# Patient Record
Sex: Female | Born: 1955 | Race: White | Hispanic: No | Marital: Single | Smoking: Former smoker
Health system: Southern US, Community
[De-identification: ages and names within clinical notes are randomized; demographics above are authoritative.]

## PROBLEM LIST (undated history)

## (undated) DIAGNOSIS — J189 Pneumonia, unspecified organism: Secondary | ICD-10-CM

## (undated) DIAGNOSIS — I10 Essential (primary) hypertension: Secondary | ICD-10-CM

## (undated) DIAGNOSIS — E785 Hyperlipidemia, unspecified: Secondary | ICD-10-CM

## (undated) DIAGNOSIS — I82409 Acute embolism and thrombosis of unspecified deep veins of unspecified lower extremity: Secondary | ICD-10-CM

## (undated) DIAGNOSIS — F32A Depression, unspecified: Secondary | ICD-10-CM

## (undated) DIAGNOSIS — H9192 Unspecified hearing loss, left ear: Secondary | ICD-10-CM

## (undated) DIAGNOSIS — G479 Sleep disorder, unspecified: Secondary | ICD-10-CM

## (undated) DIAGNOSIS — M1990 Unspecified osteoarthritis, unspecified site: Secondary | ICD-10-CM

## (undated) DIAGNOSIS — I671 Cerebral aneurysm, nonruptured: Secondary | ICD-10-CM

## (undated) DIAGNOSIS — K219 Gastro-esophageal reflux disease without esophagitis: Secondary | ICD-10-CM

## (undated) DIAGNOSIS — Z87442 Personal history of urinary calculi: Secondary | ICD-10-CM

## (undated) HISTORY — PX: TONSILLECTOMY: SUR1361

## (undated) HISTORY — PX: ABDOMINAL HYSTERECTOMY: SHX81

## (undated) HISTORY — DX: Sleep disorder, unspecified: G47.9

## (undated) HISTORY — DX: Cerebral aneurysm, nonruptured: I67.1

## (undated) HISTORY — PX: SHOULDER ARTHROSCOPY: SHX128

## (undated) HISTORY — DX: Acute embolism and thrombosis of unspecified deep veins of unspecified lower extremity: I82.409

## (undated) HISTORY — DX: Depression, unspecified: F32.A

## (undated) HISTORY — PX: CATARACT EXTRACTION: SUR2

## (undated) HISTORY — DX: Essential (primary) hypertension: I10

## (undated) HISTORY — PX: PARATHYROIDECTOMY: SHX19

## (undated) HISTORY — DX: Gastro-esophageal reflux disease without esophagitis: K21.9

## (undated) HISTORY — DX: Hyperlipidemia, unspecified: E78.5

## (undated) HISTORY — PX: OTHER SURGICAL HISTORY: SHX169

## (undated) HISTORY — DX: Unspecified hearing loss, left ear: H91.92

---

## 2016-11-19 DIAGNOSIS — G459 Transient cerebral ischemic attack, unspecified: Secondary | ICD-10-CM

## 2016-11-19 HISTORY — DX: Transient cerebral ischemic attack, unspecified: G45.9

## 2019-01-04 DIAGNOSIS — H2513 Age-related nuclear cataract, bilateral: Secondary | ICD-10-CM | POA: Diagnosis not present

## 2019-01-05 DIAGNOSIS — J209 Acute bronchitis, unspecified: Secondary | ICD-10-CM | POA: Diagnosis not present

## 2019-01-05 DIAGNOSIS — R05 Cough: Secondary | ICD-10-CM | POA: Diagnosis not present

## 2019-01-05 DIAGNOSIS — R067 Sneezing: Secondary | ICD-10-CM | POA: Diagnosis not present

## 2019-01-26 DIAGNOSIS — Z23 Encounter for immunization: Secondary | ICD-10-CM | POA: Diagnosis not present

## 2019-01-26 DIAGNOSIS — E782 Mixed hyperlipidemia: Secondary | ICD-10-CM | POA: Diagnosis not present

## 2019-01-26 DIAGNOSIS — Z9889 Other specified postprocedural states: Secondary | ICD-10-CM | POA: Diagnosis not present

## 2019-01-26 DIAGNOSIS — K219 Gastro-esophageal reflux disease without esophagitis: Secondary | ICD-10-CM | POA: Diagnosis not present

## 2019-01-26 DIAGNOSIS — I671 Cerebral aneurysm, nonruptured: Secondary | ICD-10-CM | POA: Diagnosis not present

## 2019-01-30 DIAGNOSIS — Z9889 Other specified postprocedural states: Secondary | ICD-10-CM | POA: Diagnosis not present

## 2019-02-01 ENCOUNTER — Other Ambulatory Visit: Payer: Self-pay | Admitting: Nurse Practitioner

## 2019-02-01 DIAGNOSIS — Z1231 Encounter for screening mammogram for malignant neoplasm of breast: Secondary | ICD-10-CM

## 2019-02-26 ENCOUNTER — Encounter: Payer: Self-pay | Admitting: Radiology

## 2019-02-26 ENCOUNTER — Ambulatory Visit
Admission: RE | Admit: 2019-02-26 | Discharge: 2019-02-26 | Disposition: A | Discharge status: Home / Self Care | Payer: 59 | Source: Ambulatory Visit | Attending: Nurse Practitioner | Admitting: Nurse Practitioner

## 2019-02-26 DIAGNOSIS — Z1231 Encounter for screening mammogram for malignant neoplasm of breast: Secondary | ICD-10-CM

## 2019-03-07 ENCOUNTER — Ambulatory Visit: Payer: Self-pay

## 2019-03-20 ENCOUNTER — Other Ambulatory Visit: Payer: Self-pay | Admitting: Nurse Practitioner

## 2019-03-20 DIAGNOSIS — R928 Other abnormal and inconclusive findings on diagnostic imaging of breast: Secondary | ICD-10-CM

## 2019-03-21 ENCOUNTER — Ambulatory Visit
Admission: RE | Admit: 2019-03-21 | Discharge: 2019-03-21 | Disposition: A | Discharge status: Home / Self Care | Payer: 59 | Source: Ambulatory Visit | Attending: Nurse Practitioner | Admitting: Nurse Practitioner

## 2019-03-21 ENCOUNTER — Other Ambulatory Visit: Payer: Self-pay | Admitting: Nurse Practitioner

## 2019-03-21 ENCOUNTER — Other Ambulatory Visit: Payer: Self-pay

## 2019-03-21 DIAGNOSIS — R928 Other abnormal and inconclusive findings on diagnostic imaging of breast: Secondary | ICD-10-CM

## 2019-03-21 DIAGNOSIS — N6002 Solitary cyst of left breast: Secondary | ICD-10-CM

## 2019-03-27 DIAGNOSIS — Z9889 Other specified postprocedural states: Secondary | ICD-10-CM | POA: Diagnosis not present

## 2019-03-27 DIAGNOSIS — I10 Essential (primary) hypertension: Secondary | ICD-10-CM | POA: Diagnosis not present

## 2019-03-27 DIAGNOSIS — K219 Gastro-esophageal reflux disease without esophagitis: Secondary | ICD-10-CM | POA: Diagnosis not present

## 2019-03-29 DIAGNOSIS — I10 Essential (primary) hypertension: Secondary | ICD-10-CM | POA: Diagnosis not present

## 2019-05-10 ENCOUNTER — Other Ambulatory Visit: Payer: Self-pay | Admitting: Interventional Radiology

## 2019-05-10 DIAGNOSIS — I729 Aneurysm of unspecified site: Secondary | ICD-10-CM

## 2019-05-10 DIAGNOSIS — I10 Essential (primary) hypertension: Secondary | ICD-10-CM | POA: Diagnosis not present

## 2019-05-10 DIAGNOSIS — J441 Chronic obstructive pulmonary disease with (acute) exacerbation: Secondary | ICD-10-CM | POA: Diagnosis not present

## 2019-05-11 DIAGNOSIS — R05 Cough: Secondary | ICD-10-CM | POA: Diagnosis not present

## 2019-05-15 DIAGNOSIS — R05 Cough: Secondary | ICD-10-CM | POA: Diagnosis not present

## 2019-06-06 ENCOUNTER — Other Ambulatory Visit: Payer: Self-pay

## 2019-06-06 ENCOUNTER — Ambulatory Visit
Admission: RE | Admit: 2019-06-06 | Discharge: 2019-06-06 | Disposition: A | Discharge status: Home / Self Care | Payer: 59 | Source: Ambulatory Visit | Attending: Interventional Radiology | Admitting: Interventional Radiology

## 2019-06-06 DIAGNOSIS — I729 Aneurysm of unspecified site: Secondary | ICD-10-CM

## 2019-09-25 ENCOUNTER — Other Ambulatory Visit: Payer: Self-pay | Admitting: Internal Medicine

## 2019-09-25 ENCOUNTER — Ambulatory Visit
Admission: RE | Admit: 2019-09-25 | Discharge: 2019-09-25 | Disposition: A | Discharge status: Home / Self Care | Payer: 59 | Source: Ambulatory Visit | Attending: Nurse Practitioner | Admitting: Nurse Practitioner

## 2019-09-25 ENCOUNTER — Other Ambulatory Visit: Payer: Self-pay

## 2019-09-25 DIAGNOSIS — N6002 Solitary cyst of left breast: Secondary | ICD-10-CM

## 2021-01-12 ENCOUNTER — Encounter: Payer: Self-pay | Admitting: *Deleted

## 2021-01-13 ENCOUNTER — Other Ambulatory Visit: Payer: Self-pay

## 2021-01-13 ENCOUNTER — Encounter: Payer: Self-pay | Admitting: Diagnostic Neuroimaging

## 2021-01-13 ENCOUNTER — Encounter: Payer: PRIVATE HEALTH INSURANCE | Admitting: Diagnostic Neuroimaging

## 2021-01-13 NOTE — Progress Notes (Signed)
Patient rescheduled appointment due to delay. She was made aware of delay and voiced understanding. Appointment was opened for her to be seen 01/28/21. Patient verbalized understanding, appreciation.

## 2021-01-28 ENCOUNTER — Ambulatory Visit (INDEPENDENT_AMBULATORY_CARE_PROVIDER_SITE_OTHER): Payer: PRIVATE HEALTH INSURANCE | Admitting: Diagnostic Neuroimaging

## 2021-01-28 ENCOUNTER — Encounter: Payer: Self-pay | Admitting: Diagnostic Neuroimaging

## 2021-01-28 VITALS — BP 160/100 | HR 61 | Ht 65.0 in | Wt 188.8 lb

## 2021-01-28 DIAGNOSIS — G8929 Other chronic pain: Secondary | ICD-10-CM | POA: Diagnosis not present

## 2021-01-28 DIAGNOSIS — M542 Cervicalgia: Secondary | ICD-10-CM | POA: Diagnosis not present

## 2021-01-28 DIAGNOSIS — M25512 Pain in left shoulder: Secondary | ICD-10-CM

## 2021-01-28 NOTE — Progress Notes (Signed)
[facility]  PATIENT: Teresa Lopez DOB: 1956/12/13  REFERRING CLINICIAN: Tisovec, Adelfa Koh, MD HISTORY FROM: patient REASON FOR VISIT: New consult   HISTORICAL  CHIEF COMPLAINT:  Chief Complaint  Patient presents with  . Cerebral aneurysm, unruptured    Rm7, New Pt Transfer of care    HISTORY OF PRESENT ILLNESS:   65 year old female here for evaluation of cerebral aneurysm.  2018 patient had transient aphasia event and went to the hospital for evaluation.  This was in [place].  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  These were successfully treated with pipeline embolization device procedure.  She had follow-up imaging in 2020 which was stable.  Since then patient has continued to stay stable.  No recurrent aphasia or TIA events.  No headaches.  Patient has had some left shoulder and left neck muscle spasm problems intermittently for the past year and was concerned about the symptoms.  Symptoms occur almost on a daily basis.  Symptoms last about a few seconds at a time.    REVIEW OF SYSTEMS: Full 14 system review of systems performed and negative with exception of: As per HPI.  ALLERGIES: Allergies  Allergen Reactions  . Codeine Nausea Only  . Sulfa Antibiotics     unknown  . Penicillins Rash    Unable to walk    HOME MEDICATIONS: Outpatient Medications Prior to Visit  Medication Sig Dispense Refill  . Ascorbic Acid (VITAMIN C) 1000 MG tablet Take 1,000 mg by mouth daily.    Marland Kitchen aspirin EC 81 MG tablet Take 81 mg by mouth daily. Swallow whole.    Marland Kitchen atorvastatin (LIPITOR) 20 MG tablet Take 20 mg by mouth daily.    . Cholecalciferol 1.25 MG (50000 UT) capsule Take 50,000 Units by mouth daily.    Marland Kitchen lisinopril (ZESTRIL) 10 MG tablet Take 20 mg by mouth daily.    . meloxicam (MOBIC) 15 MG tablet Take 15 mg by mouth daily.    Marland Kitchen METOPROLOL TARTRATE  PO Take 50 mg by mouth 2 (two) times daily.    Marland Kitchen omeprazole (PRILOSEC) 20 MG capsule Take 40 mg by mouth daily.    . sertraline (ZOLOFT) 100 MG tablet Take 100 mg by mouth daily.    . vitamin E 180 MG (400 UNITS) capsule Take 400 Units by mouth daily.    Marland Kitchen ipratropium (ATROVENT) 0.03 % nasal spray Place 2 sprays into both nostrils every 12 (twelve) hours.    Marland Kitchen acyclovir ointment (ZOVIRAX) 5 % Apply 1 application topically 3 (three) times daily. (Patient not taking: Reported on 01/13/2021)     No facility-administered medications prior to visit.    PAST MEDICAL HISTORY: Past Medical History:  Diagnosis Date  . Cerebral aneurysm    s/p stent x 2 12/2016  . Deaf, left   . Depression   . DVT (deep venous thrombosis) (HCC)    age 5  . GERD (gastroesophageal reflux disease)   . Hyperlipidemia   . Hypertension   . Sleep disorder    snores  . TIA (transient ischemic attack) 11/2016    PAST SURGICAL HISTORY: Past Surgical History:  Procedure Laterality Date  . ABDOMINAL HYSTERECTOMY    . kidney stones    . laser surgery     x 3- endometriosis  . PARATHYROIDECTOMY    . TONSILLECTOMY     x 2    FAMILY HISTORY: Family History  Problem Relation  Age of Onset  . Breast cancer Sister 77       second time at 81   . Cancer Mother        brain  . Thyroid disease Mother   . Heart disease Father   . Cancer Father   . Diabetes Maternal Grandmother   . Cancer Paternal Grandmother   . Hypothyroidism Sister   . Other Brother        cerebral meningitis    SOCIAL HISTORY: Social History   Socioeconomic History  . Marital status: Single    Spouse name: Not on file  . Number of children: 1  . Years of education: Not on file  . Highest education level: High school graduate  Occupational History    Comment: Education officer, environmental  Tobacco Use  . Smoking status: Former Smoker    Packs/day: 1.00    Years: 45.00    Pack years: 45.00    Quit date: 01/12/2019    Years since  quitting: 2.0  . Smokeless tobacco: Never Used  Substance and Sexual Activity  . Alcohol use: Yes    Comment: socially  . Drug use: Never  . Sexual activity: Not on file  Other Topics Concern  . Not on file  Social History Narrative   Caffeine- coffee maybe 1 c daily   Social Determinants of Health   Financial Resource Strain: Not on file  Food Insecurity: Not on file  Transportation Needs: Not on file  Physical Activity: Not on file  Stress: Not on file  Social Connections: Not on file  Intimate Partner Violence: Not on file     PHYSICAL EXAM  GENERAL EXAM/CONSTITUTIONAL: Vitals:  Vitals:   01/28/21 0919  BP: (!) 160/100  Pulse: 61  Weight: 188 lb 12.8 oz (85.6 kg)  Height: 5\' 5"  (1.651 m)     Body mass index is 31.42 kg/m. Wt Readings from Last 3 Encounters:  01/28/21 188 lb 12.8 oz (85.6 kg)  01/13/21 183 lb 12.8 oz (83.4 kg)     Patient is in no distress; well developed, nourished and groomed; neck is supple  CARDIOVASCULAR:  Examination of carotid arteries is normal; no carotid bruits  Regular rate and rhythm, no murmurs  Examination of peripheral vascular system by observation and palpation is normal  EYES:  Ophthalmoscopic exam of optic discs and posterior segments is normal; no papilledema or hemorrhages  No exam data present  MUSCULOSKELETAL:  Gait, strength, tone, movements noted in Neurologic exam below  NEUROLOGIC: MENTAL STATUS:  No flowsheet data found.  awake, alert, oriented to person, place and time  recent and remote memory intact  normal attention and concentration  language fluent, comprehension intact, naming intact  fund of knowledge appropriate  CRANIAL NERVE:   2nd - no papilledema on fundoscopic exam  2nd, 3rd, 4th, 6th - pupils equal and reactive to light, visual fields full to confrontation, extraocular muscles intact, no nystagmus  5th - facial sensation symmetric  7th - facial strength symmetric  8th  - hearing intact  9th - palate elevates symmetrically, uvula midline  11th - shoulder shrug symmetric  12th - tongue protrusion midline  MOTOR:   normal bulk and tone, full strength in the BUE, BLE  SENSORY:   normal and symmetric to light touch, temperature, vibration  COORDINATION:   finger-nose-finger, fine finger movements normal  REFLEXES:   deep tendon reflexes present and symmetric  GAIT/STATION:   narrow based gait     DIAGNOSTIC DATA (  LABS, IMAGING, TESTING) - I reviewed patient records, labs, notes, testing and imaging myself where available.  No results found for: WBC, HGB, HCT, MCV, PLT No results found for: NA, K, CL, CO2, GLUCOSE, BUN, CREATININE, CALCIUM, PROT, ALBUMIN, AST, ALT, ALKPHOS, BILITOT, GFRNONAA, GFRAA No results found for: CHOL, HDL, LDLCALC, LDLDIRECT, TRIG, CHOLHDL No results found for: HUDJ4H No results found for: VITAMINB12 No results found for: TSH   06/06/19 MRA head - No evidence of residual or recurrent aneurysmal flow.    ASSESSMENT AND PLAN  65 y.o. year old female here with:  Dx:  1. Chronic left shoulder pain   2. Neck pain on left side      PLAN:  HISTORY OF CEREBRAL ANEURYSMS (left para-ophthalmic and left PCOMM aneurysms; asymptomatic, unruptured; discovered incidentally in 2018 during TIA evaluation in [facility]; s/p pipeline embolization device treatment in 12/23/16) - continue aspirin 162mg  daily long term - check BP at home; maintain normotensive goal - continue smoking cessation - patient will follow up with Dr. about long term aneurysm monitoring schedule (every 3-5 years typically)  LEFT NECK / SHOULDER PAIN / SPASM - consider sports medicine evaluation  Orders Placed This Encounter  Procedures  . AMB referral to sports medicine   Return for pending if symptoms worsen or fail to improve, return to PCP.    Manual Meier, MD 01/28/2021, 9:50 AM Certified in Neurology,  Neurophysiology and Neuroimaging  [facility] [address] [place], [place] [id number]

## 2021-01-29 NOTE — Progress Notes (Signed)
This encounter was created in error - please disregard.

## 2021-01-30 ENCOUNTER — Other Ambulatory Visit: Payer: Self-pay

## 2021-01-30 ENCOUNTER — Encounter: Payer: Self-pay | Admitting: Family Medicine

## 2021-01-30 ENCOUNTER — Ambulatory Visit
Admission: RE | Admit: 2021-01-30 | Discharge: 2021-01-30 | Disposition: A | Discharge status: Home / Self Care | Payer: No Typology Code available for payment source | Source: Ambulatory Visit | Attending: Family Medicine | Admitting: Family Medicine

## 2021-01-30 ENCOUNTER — Ambulatory Visit (INDEPENDENT_AMBULATORY_CARE_PROVIDER_SITE_OTHER): Payer: No Typology Code available for payment source | Admitting: Family Medicine

## 2021-01-30 VITALS — BP 130/90 | Ht 65.0 in | Wt 185.0 lb

## 2021-01-30 DIAGNOSIS — M5412 Radiculopathy, cervical region: Secondary | ICD-10-CM | POA: Diagnosis not present

## 2021-01-30 NOTE — Progress Notes (Signed)
PCP: Gaspar Garbe, MD  Subjective:   HPI: Patient is a 65 y.o. female with history of cerebral aneurysms here for evaluation of chronic left shoulder pain.  She was recently seen by her neurologist, and noted to him that she was having left shoulder pain.  He did not feel this was not likely neurologic in nature or related to her aneurysms and recommended following up here for further evaluation.  Today, patient states that she has been having chronic left-sided neck pain and spasming for a long time, however the last month or 2 she is started having pain more distally anterior trapezius muscle and into her shoulder.  It stops at her shoulder and does not go down into her arm.  She states that the pain will hit her at seemingly random times, sometimes with certain motions but she is unable to reproduce these reliably.  She has some pain in her anterior shoulder but feels it is mostly over the upper shoulder/trapezius area.  She does state that it sometimes feels like a numbness and tingling but mostly as a sharp shooting pain.  She has not noticed any pain going into her arm, no weakness in her upper extremity.  No traumatic injury prior to the onset of symptoms.    Review of Systems:  Per HPI.   PMFSH, medications and smoking status reviewed.      Objective:  Physical Exam:  No flowsheet data found.   Gen: awake, alert, NAD, comfortable in exam room Pulm: breathing unlabored  Cervical spine: -Inspection: No significant abnormalities, no erythema, edema or ecchymoses. -Palpation: Tenderness palpation of the left paraspinal muscles and left trapezius with some increased muscle tension compared to contralateral side. -ROM: Full range of motion cervical spine without pain. -Strength: 5/5 strength in all planes cervical spine, some pain with resisted left lateral flexion. -Sensation: Normal sensation in the neck and bilateral upper extremities -Special tests: Negative  Spurling's  Left shoulder: -Inspection: no obvious deformity, atrophy, or asymmetry. No bruising. No swelling -Palpation: No tenderness over AC joint, + tenderness over bicipital groove -ROM: Full ROM in abduction, flexion, internal/external rotation both passively and actively.  She does have inconsistent intermittent pains in all planes.  NV intact distally Normal scapular function observed. Special Tests:  - Impingement: Neg Hawkins, Neg neers, Neg empty can sign. - Supraspinatus: Negative empty can.  5/5 strength with resisted flexion at 20 degrees - Infraspinatus/Teres Minor: 5/5 strength with ER - Subscapularis: 5/5 strength with IR, but with pain - Biceps tendon:  + Speeds  - Labrum: Negative Obriens, good stability - No painful arc and no drop arm sign  Limited US shoulder: -Biceps tendon: Well visualized within the bicipital groove and without any abnormalities.  No significant increased Doppler flow. -Subscapularis: Well visualized to insertion point on humerus.  Mild chronic degenerative changes without any acute abnormalities. Dynamic testing over the coracoid did not show signs of impingement. -AC joint: Small amount of arthritic changes without significant geyser sign or separation. -Supraspinatus: Well-visualized and without any abnormalities.  Dynamic testing did not reveal signs of impingement. -Subacromial bursa: No obvious enlargement or swelling. -Infraspinatus/teres minor: Insertion point on posterior humerus visualized and without abnormalities. -Glenohumeral joint: No significant normalities  Impression: Unremarkable examination of the left shoulder with mild chronic degenerative changes of the subscapularis.  Ultrasound performed interpreted by Al Decant, MD and Denny Levy, MD     Assessment & Plan:  1.  Left cervical radiculopathy  Differential for patient's pain  include cervical radiculopathy versus muscle strain versus rotator cuff pathology versus  biceps tendinopathy.  US examination of the shoulder including biceps tendon was fairly unremarkable making shoulder pathology less likely.  She also does have pain more in her trapezius and lateral neck raising the concern for cervical radiculopathy.  Plan: -XR cervical spine -Oral anti-inflammatories as needed -HEP with trapezius strengthening and neck stretching -Follow-up in 2 to 3 weeks to reevaluate   Guy Sandifer, MD [facility] Sports Medicine Fellow 01/30/2021 10:59 AM

## 2021-01-30 NOTE — Progress Notes (Signed)
[facility]: Attending Note: I have reviewed the chart, discussed wit the Sports Medicine Fellow. I agree with assessment and treatment plan as detailed in the Fellow's note. Left shoulder pain / left sided anterior neck pain, > 4 weeks. Right and dominant. Does some work as a Conservation officer, nature (righthand working Art gallery manager) Pain c/w chronic left trapezius pain with spasm. Deep palpation over the trap  reproduces her pain. She has some mild ttp SCM left.   I do not think this is related in any way toher known cerbral aneurysms and we discussed that with her and she was reassured. I reviewed her shoulder [place] and I do nto think this is related to any primary shoulder pathology although she has some mild AC joint DJD.    I think this is chronic strain and spasm of trapezius and SCM. Will get C spine films to rule out anything acute. HEP with emphasis on shoulder shrugs, scapular retraction, resisted neck rotation and f/u 2-3 weeks.

## 2021-02-06 ENCOUNTER — Encounter: Payer: Self-pay | Admitting: Family Medicine

## 2021-02-13 ENCOUNTER — Other Ambulatory Visit: Payer: Self-pay

## 2021-02-13 ENCOUNTER — Ambulatory Visit (INDEPENDENT_AMBULATORY_CARE_PROVIDER_SITE_OTHER): Payer: No Typology Code available for payment source | Admitting: Family Medicine

## 2021-02-13 DIAGNOSIS — M542 Cervicalgia: Secondary | ICD-10-CM | POA: Diagnosis not present

## 2021-02-13 NOTE — Progress Notes (Signed)
    SUBJECTIVE:   CHIEF COMPLAINT / HPI:   Neck pain Ms. Babich is a very pleasant 65 year old female who is coming in today for follow-up after having x-rays of her cervical spine and previously having neck pain that is radiating to the left shoulder.  Unfortunately her symptoms are still occurring although they are not worse they have not been much improved with conservative management such as heating pad, over-the-counter NSAIDs, and home exercises.  She was unaware of her x-ray results from her previous visit so we did discuss those today and talked about neck steps going forward.  Did position with her we could refer her degrees for imaging for facet joint injections to see if that offers any relief however she declines.  Overall she thinks her symptoms are somewhat manageable but would like for them to improve somewhat.  PERTINENT  PMH / PSH: Hypertension, GERD, hyperlipidemia  OBJECTIVE:   BP 118/86   Ht 5\' 5"  (1.651 m)   Wt 185 lb (83.9 kg)   BMI 30.79 kg/m   No flowsheet data found.  MSK: Inspection of the cervical spine is significant for no obvious deformity or step-offs or tenderness to palpation over the spinous processes.  She does have tenderness to palpation over the left side of the cervical paraspinal musculature.  She has good range of motion in her neck in flexion and extension, somewhat limited in rotation and side bending.  Upper extremity strength is intact rated 5/5 and gross sensation intact.  ASSESSMENT/PLAN:   Neck pain Patient is here for follow-up of neck pain and left shoulder pain.  Given that her ultrasound was normal last time and the pain is radiating from the neck to the left shoulder and her x-ray findings are consistent with cervical facet joint arthropathy I do think it is refill at this point to say the pain is coming from arthritis within the cervical spine.  She has been using conservative management with over-the-counter medications and muscle  relaxer unfortunately this is not giving her much relief.  Did offer her possibility of getting referred to [facility] imaging for an injection into the facet joints to see if that will give her some relief however she is not quite ready to do this just yet.  Given that she is having no radicular symptoms down the arm with no numbness tingling or burning unlikely that she would benefit from further imaging at this point however I did let her know that if her symptoms worsen or she develops any symptoms right radiculopathy we would need to proceed to an MRI to get more information.  She will try Voltaren gel and continue with exercises using a heating pad and over-the-counter nonsteroidal anti-inflammatories to keep control of the pain. Given that the degenerative disc disease of the cervical spine was somewhat extensive we did go ahead and refer her to a neurosurgeon to discuss what options might be available at this juncture from their point.  She does not want surgery at this time however we thought it best to go ahead and get her seen earlier rather than later.     [facility], DO PGY-4, Sports Medicine Fellow [facility]

## 2021-02-13 NOTE — Patient Instructions (Signed)
It was great to meet you today! Thank you for letting me participate in your care!  Today, we discussed your continued neck pain which is from cervical degenerative disc disease as well as arthritis in your cervical spine. Please try Voltaren gel and you can apply it up to 4 times per day on your neck. Continue using Tylenol arthritis strength, using heating pads, and doing stretching and strengthening exercises.  If the pain gets worse and it is too much to handle please let us know and we can set you up for an injection in your cervical spine that will hopefully relieve some of your symptoms.  Be well, Jules Schick, DO PGY-4, Sports Medicine Fellow [facility]

## 2021-02-14 DIAGNOSIS — M542 Cervicalgia: Secondary | ICD-10-CM | POA: Insufficient documentation

## 2021-02-14 NOTE — Assessment & Plan Note (Signed)
Patient is here for follow-up of neck pain and left shoulder pain.  Given that her ultrasound was normal last time and the pain is radiating from the neck to the left shoulder and her x-ray findings are consistent with cervical facet joint arthropathy I do think it is refill at this point to say the pain is coming from arthritis within the cervical spine.  She has been using conservative management with over-the-counter medications and muscle relaxer unfortunately this is not giving her much relief.  Did offer her possibility of getting referred to [facility] imaging for an injection into the facet joints to see if that will give her some relief however she is not quite ready to do this just yet.  Given that she is having no radicular symptoms down the arm with no numbness tingling or burning unlikely that she would benefit from further imaging at this point however I did let her know that if her symptoms worsen or she develops any symptoms right radiculopathy we would need to proceed to an MRI to get more information.  She will try Voltaren gel and continue with exercises using a heating pad and over-the-counter nonsteroidal anti-inflammatories to keep control of the pain. Given that the degenerative disc disease of the cervical spine was somewhat extensive we did go ahead and refer her to a neurosurgeon to discuss what options might be available at this juncture from their point.  She does not want surgery at this time however we thought it best to go ahead and get her seen earlier rather than later.

## 2021-02-15 NOTE — Progress Notes (Signed)
[facility]: Attending Note: I have reviewed the chart, discussed wit the Sports Medicine Fellow. I agree with assessment and treatment plan as detailed in the Fellow's note. Given the severity of her neck DJD as seen on plain film, I would like NSU to evaluate as I suspect she may need consideration of stabilization, if not now then at some future date. She is amenable to NSU evaluation. She has hx of parathyroid or thryoid surgery and those surgical clips are also seen on x rays but do not seem to be of consequence.

## 2021-05-27 ENCOUNTER — Other Ambulatory Visit: Payer: Self-pay | Admitting: Internal Medicine

## 2021-05-27 DIAGNOSIS — Z1231 Encounter for screening mammogram for malignant neoplasm of breast: Secondary | ICD-10-CM

## 2021-06-08 ENCOUNTER — Encounter (INDEPENDENT_AMBULATORY_CARE_PROVIDER_SITE_OTHER): Payer: Managed Care, Other (non HMO) | Admitting: Ophthalmology

## 2021-06-08 ENCOUNTER — Other Ambulatory Visit: Payer: Self-pay

## 2021-06-08 DIAGNOSIS — I10 Essential (primary) hypertension: Secondary | ICD-10-CM | POA: Diagnosis not present

## 2021-06-08 DIAGNOSIS — H43813 Vitreous degeneration, bilateral: Secondary | ICD-10-CM | POA: Diagnosis not present

## 2021-06-08 DIAGNOSIS — H353132 Nonexudative age-related macular degeneration, bilateral, intermediate dry stage: Secondary | ICD-10-CM | POA: Diagnosis not present

## 2021-06-08 DIAGNOSIS — H2513 Age-related nuclear cataract, bilateral: Secondary | ICD-10-CM

## 2021-06-08 DIAGNOSIS — H35033 Hypertensive retinopathy, bilateral: Secondary | ICD-10-CM | POA: Diagnosis not present

## 2021-06-17 ENCOUNTER — Other Ambulatory Visit: Payer: Self-pay

## 2021-06-17 ENCOUNTER — Ambulatory Visit
Admission: RE | Admit: 2021-06-17 | Discharge: 2021-06-17 | Disposition: A | Discharge status: Home / Self Care | Payer: No Typology Code available for payment source | Source: Ambulatory Visit | Attending: Internal Medicine | Admitting: Internal Medicine

## 2021-06-17 DIAGNOSIS — Z1231 Encounter for screening mammogram for malignant neoplasm of breast: Secondary | ICD-10-CM

## 2021-09-05 IMAGING — MG MM DIGITAL SCREENING BILAT W/ TOMO AND CAD
8 series · 8 of 24 positions shown · non-contrast
Comparison: Previous exam(s).

CLINICAL DATA: Screening.

EXAM:
DIGITAL SCREENING BILATERAL MAMMOGRAM WITH TOMOSYNTHESIS AND CAD
TECHNIQUE: Bilateral screening digital craniocaudal and mediolateral oblique
mammograms were obtained. Bilateral screening digital breast
tomosynthesis was performed. The images were evaluated with
computer-aided detection.

[R MLO synth-2D]
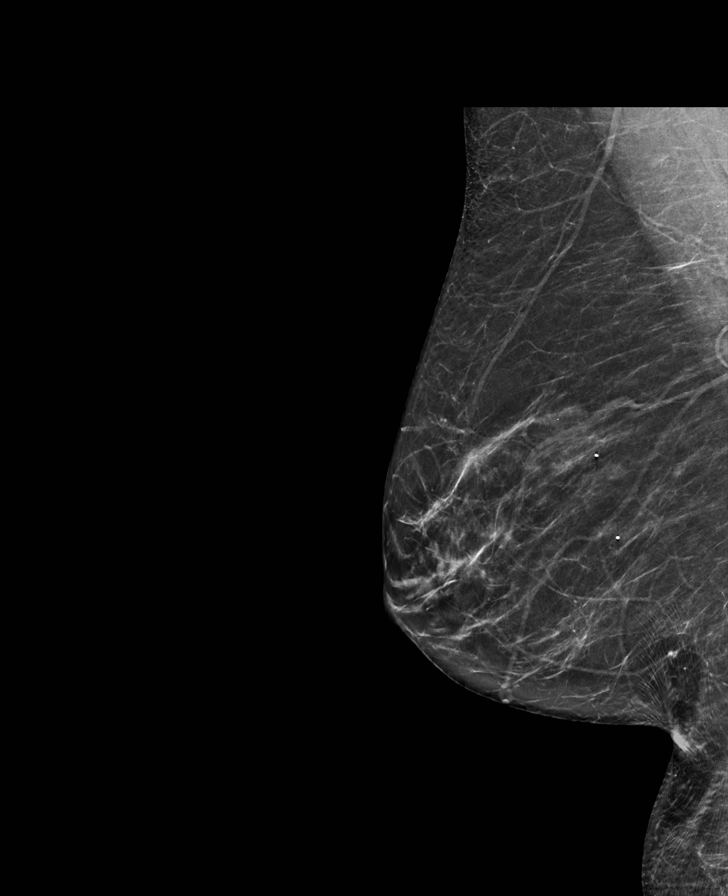

[L MLO synth-2D]
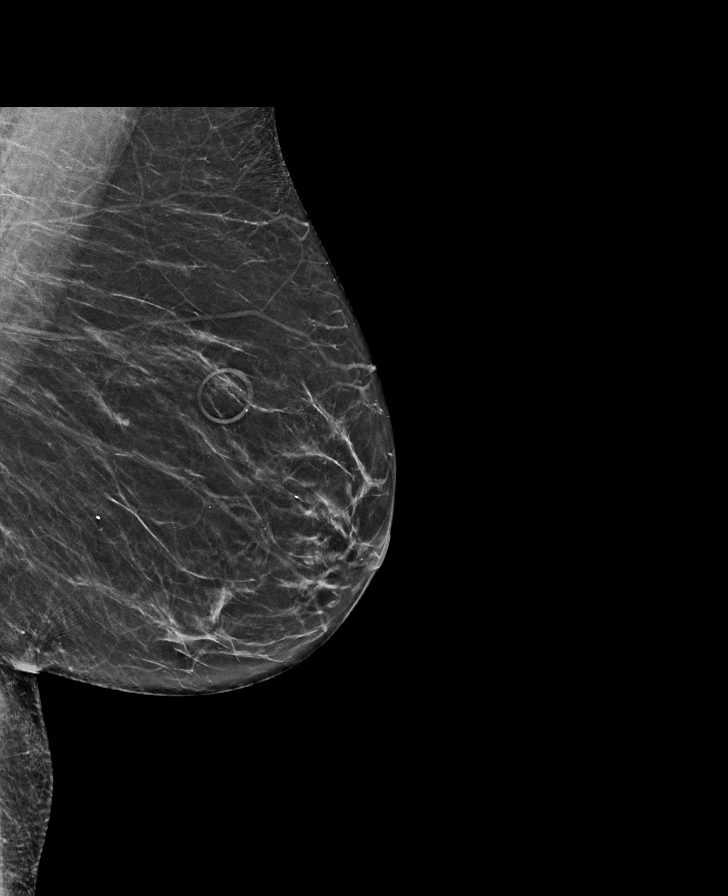

[L CC synth-2D]
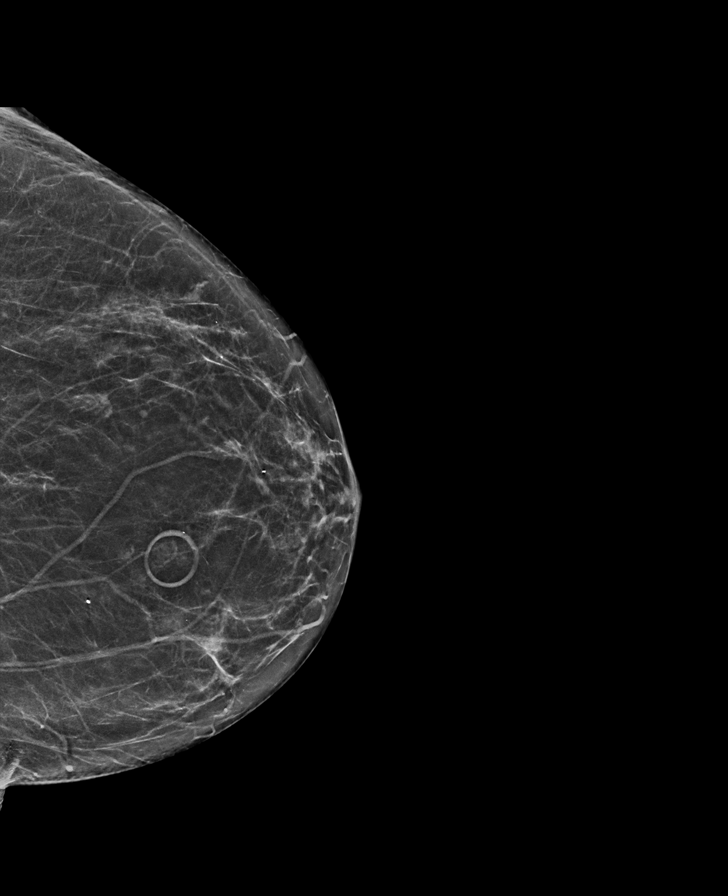

[R CC synth-2D]
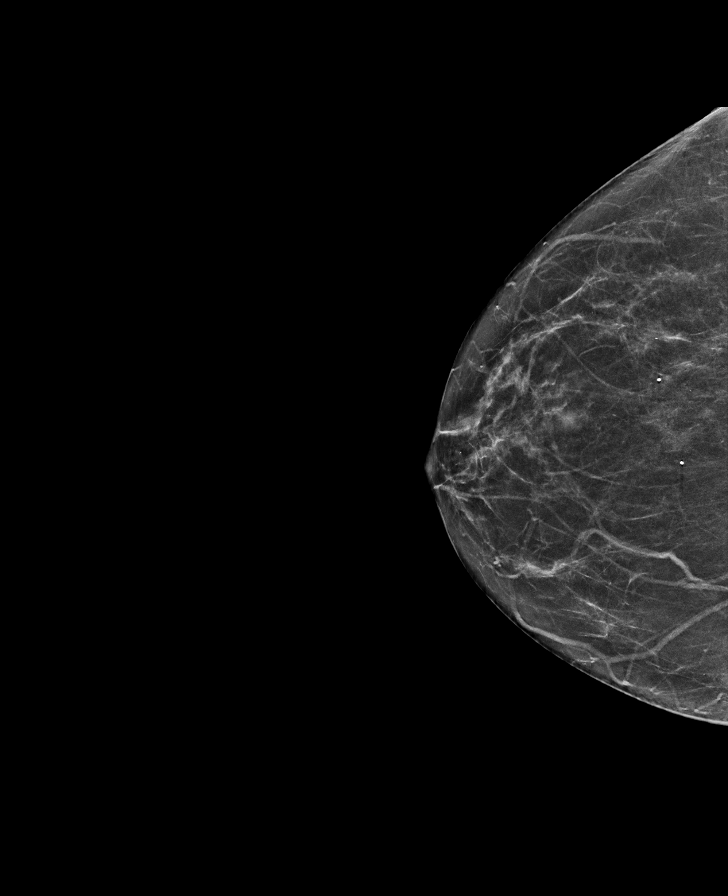

[R CC tomo · tomo slice 29/56.0]
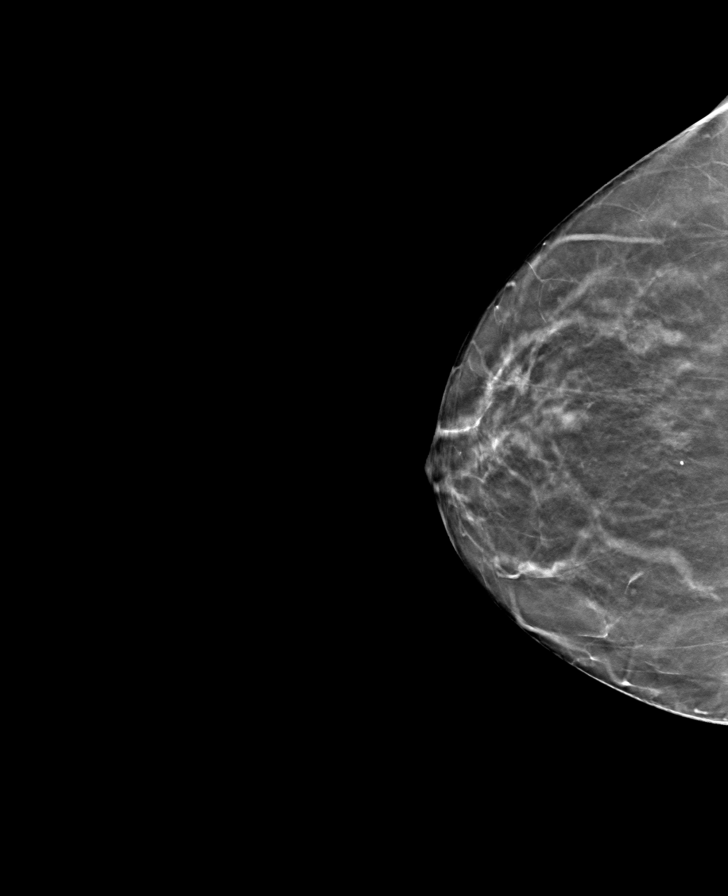

[L MLO tomo · tomo slice 31/61.0]
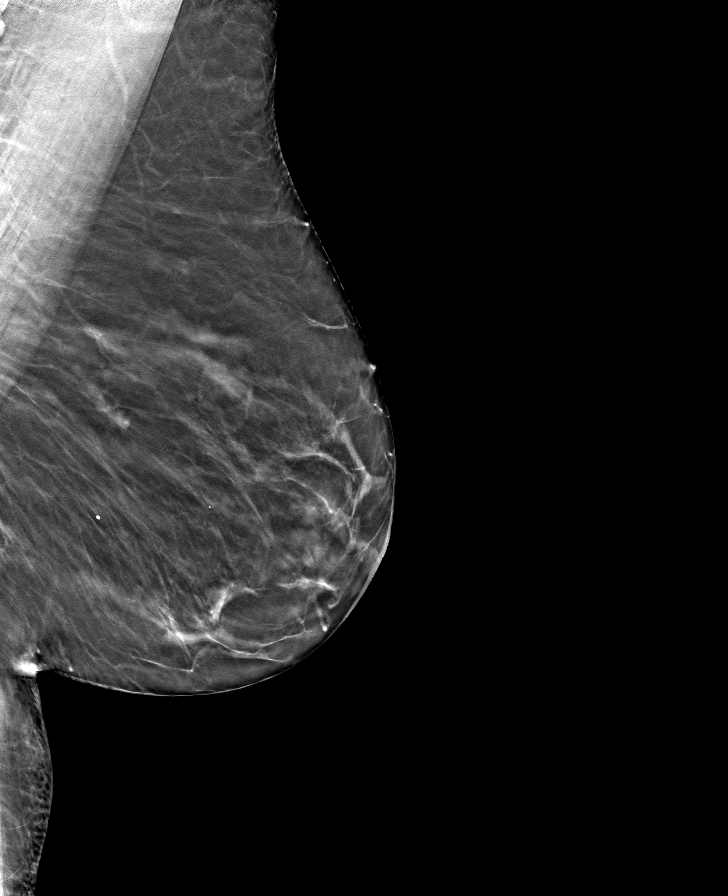

[R MLO tomo · tomo slice 33/65.0]
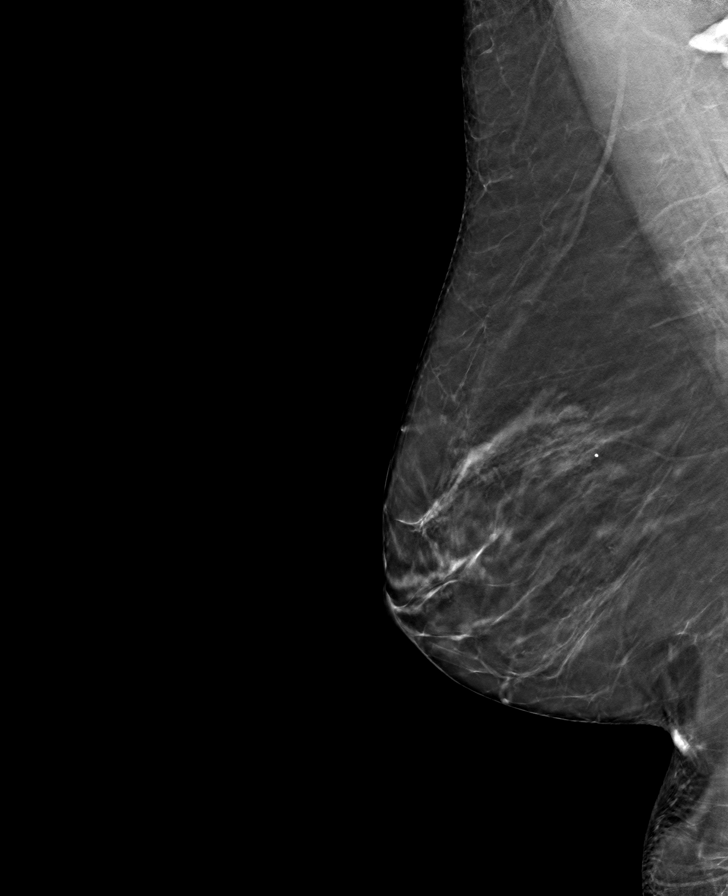

[L CC tomo · tomo slice 30/59.0]
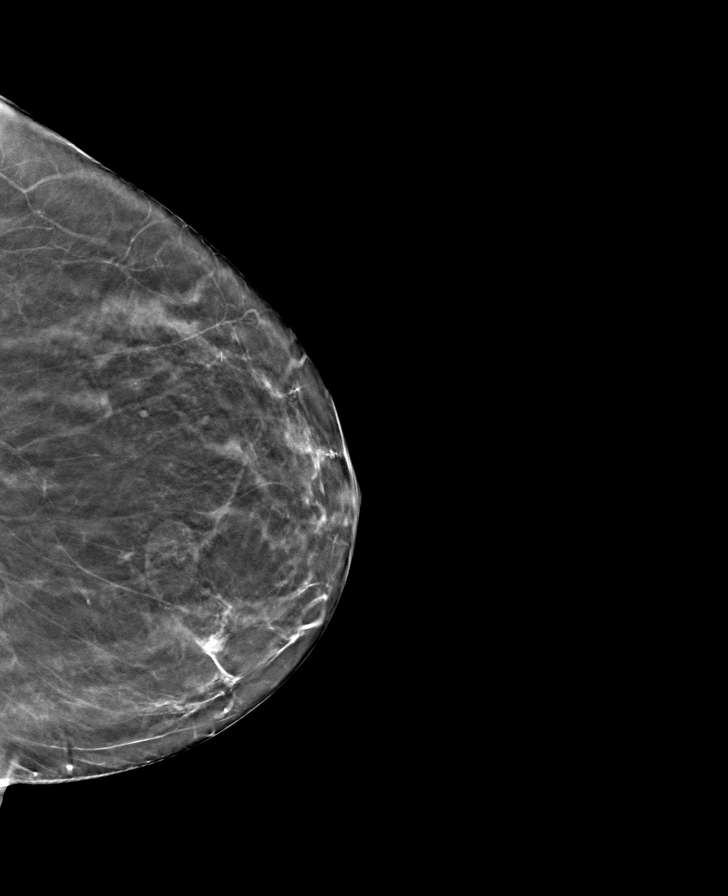

[8 of 24 positions shown; findings below may reference images not displayed]

ACR Breast Density Category b: There are scattered areas of
fibroglandular density.
FINDINGS: There are no findings suspicious for malignancy.
IMPRESSION: No mammographic evidence of malignancy. A result letter of this
screening mammogram will be mailed directly to the patient.

RECOMMENDATION:
Screening mammogram in one year. (Code:51-O-LD2)

BI-RADS CATEGORY  1: Negative.

## 2021-10-19 ENCOUNTER — Encounter (INDEPENDENT_AMBULATORY_CARE_PROVIDER_SITE_OTHER): Payer: Managed Care, Other (non HMO) | Admitting: Ophthalmology

## 2022-05-14 ENCOUNTER — Other Ambulatory Visit: Payer: Self-pay | Admitting: Internal Medicine

## 2022-05-14 DIAGNOSIS — Z1231 Encounter for screening mammogram for malignant neoplasm of breast: Secondary | ICD-10-CM

## 2022-06-18 ENCOUNTER — Ambulatory Visit
Admission: RE | Admit: 2022-06-18 | Discharge: 2022-06-18 | Disposition: A | Discharge status: Home / Self Care | Payer: Managed Care, Other (non HMO) | Source: Ambulatory Visit | Attending: Internal Medicine | Admitting: Internal Medicine

## 2022-06-18 DIAGNOSIS — Z1231 Encounter for screening mammogram for malignant neoplasm of breast: Secondary | ICD-10-CM

## 2022-06-26 DIAGNOSIS — Z743 Need for continuous supervision: Secondary | ICD-10-CM | POA: Diagnosis not present

## 2022-06-26 DIAGNOSIS — F10929 Alcohol use, unspecified with intoxication, unspecified: Secondary | ICD-10-CM | POA: Diagnosis not present

## 2022-11-21 DIAGNOSIS — J069 Acute upper respiratory infection, unspecified: Secondary | ICD-10-CM | POA: Diagnosis not present

## 2022-11-21 DIAGNOSIS — Z20822 Contact with and (suspected) exposure to covid-19: Secondary | ICD-10-CM | POA: Diagnosis not present

## 2022-11-21 DIAGNOSIS — J019 Acute sinusitis, unspecified: Secondary | ICD-10-CM | POA: Diagnosis not present

## 2022-11-21 DIAGNOSIS — I10 Essential (primary) hypertension: Secondary | ICD-10-CM | POA: Diagnosis not present

## 2022-11-21 DIAGNOSIS — R059 Cough, unspecified: Secondary | ICD-10-CM | POA: Diagnosis not present

## 2022-11-26 DIAGNOSIS — F17201 Nicotine dependence, unspecified, in remission: Secondary | ICD-10-CM | POA: Diagnosis not present

## 2022-11-26 DIAGNOSIS — G479 Sleep disorder, unspecified: Secondary | ICD-10-CM | POA: Diagnosis not present

## 2022-11-26 DIAGNOSIS — D649 Anemia, unspecified: Secondary | ICD-10-CM | POA: Diagnosis not present

## 2022-11-26 DIAGNOSIS — K219 Gastro-esophageal reflux disease without esophagitis: Secondary | ICD-10-CM | POA: Diagnosis not present

## 2022-11-26 DIAGNOSIS — F321 Major depressive disorder, single episode, moderate: Secondary | ICD-10-CM | POA: Diagnosis not present

## 2022-11-26 DIAGNOSIS — E7800 Pure hypercholesterolemia, unspecified: Secondary | ICD-10-CM | POA: Diagnosis not present

## 2022-11-26 DIAGNOSIS — I10 Essential (primary) hypertension: Secondary | ICD-10-CM | POA: Diagnosis not present

## 2022-11-26 DIAGNOSIS — E669 Obesity, unspecified: Secondary | ICD-10-CM | POA: Diagnosis not present

## 2022-11-26 DIAGNOSIS — Z1331 Encounter for screening for depression: Secondary | ICD-10-CM | POA: Diagnosis not present

## 2022-12-03 DIAGNOSIS — M25512 Pain in left shoulder: Secondary | ICD-10-CM | POA: Diagnosis not present

## 2023-01-10 DIAGNOSIS — H16101 Unspecified superficial keratitis, right eye: Secondary | ICD-10-CM | POA: Diagnosis not present

## 2023-01-14 DIAGNOSIS — M25512 Pain in left shoulder: Secondary | ICD-10-CM | POA: Diagnosis not present

## 2023-01-31 DIAGNOSIS — M25512 Pain in left shoulder: Secondary | ICD-10-CM | POA: Diagnosis not present

## 2023-02-04 DIAGNOSIS — M75122 Complete rotator cuff tear or rupture of left shoulder, not specified as traumatic: Secondary | ICD-10-CM | POA: Diagnosis not present

## 2023-02-21 DIAGNOSIS — H04123 Dry eye syndrome of bilateral lacrimal glands: Secondary | ICD-10-CM | POA: Diagnosis not present

## 2023-02-21 DIAGNOSIS — H26493 Other secondary cataract, bilateral: Secondary | ICD-10-CM | POA: Diagnosis not present

## 2023-02-21 DIAGNOSIS — H35033 Hypertensive retinopathy, bilateral: Secondary | ICD-10-CM | POA: Diagnosis not present

## 2023-02-21 DIAGNOSIS — H353132 Nonexudative age-related macular degeneration, bilateral, intermediate dry stage: Secondary | ICD-10-CM | POA: Diagnosis not present

## 2023-02-22 DIAGNOSIS — M7522 Bicipital tendinitis, left shoulder: Secondary | ICD-10-CM | POA: Diagnosis not present

## 2023-02-22 DIAGNOSIS — M75122 Complete rotator cuff tear or rupture of left shoulder, not specified as traumatic: Secondary | ICD-10-CM | POA: Diagnosis not present

## 2023-02-22 DIAGNOSIS — M7542 Impingement syndrome of left shoulder: Secondary | ICD-10-CM | POA: Diagnosis not present

## 2023-03-14 DIAGNOSIS — M7552 Bursitis of left shoulder: Secondary | ICD-10-CM | POA: Diagnosis not present

## 2023-03-14 DIAGNOSIS — M24112 Other articular cartilage disorders, left shoulder: Secondary | ICD-10-CM | POA: Diagnosis not present

## 2023-03-14 DIAGNOSIS — M7522 Bicipital tendinitis, left shoulder: Secondary | ICD-10-CM | POA: Diagnosis not present

## 2023-03-14 DIAGNOSIS — M7542 Impingement syndrome of left shoulder: Secondary | ICD-10-CM | POA: Diagnosis not present

## 2023-03-14 DIAGNOSIS — G8918 Other acute postprocedural pain: Secondary | ICD-10-CM | POA: Diagnosis not present

## 2023-03-21 DIAGNOSIS — M25512 Pain in left shoulder: Secondary | ICD-10-CM | POA: Diagnosis not present

## 2023-03-24 DIAGNOSIS — M25512 Pain in left shoulder: Secondary | ICD-10-CM | POA: Diagnosis not present

## 2023-04-01 DIAGNOSIS — M25512 Pain in left shoulder: Secondary | ICD-10-CM | POA: Diagnosis not present

## 2023-04-04 DIAGNOSIS — H26491 Other secondary cataract, right eye: Secondary | ICD-10-CM | POA: Diagnosis not present

## 2023-04-04 DIAGNOSIS — M25512 Pain in left shoulder: Secondary | ICD-10-CM | POA: Diagnosis not present

## 2023-04-06 DIAGNOSIS — M25512 Pain in left shoulder: Secondary | ICD-10-CM | POA: Diagnosis not present

## 2023-04-12 DIAGNOSIS — M25512 Pain in left shoulder: Secondary | ICD-10-CM | POA: Diagnosis not present

## 2023-04-14 DIAGNOSIS — M25512 Pain in left shoulder: Secondary | ICD-10-CM | POA: Diagnosis not present

## 2023-04-18 DIAGNOSIS — M25512 Pain in left shoulder: Secondary | ICD-10-CM | POA: Diagnosis not present

## 2023-04-27 DIAGNOSIS — H26492 Other secondary cataract, left eye: Secondary | ICD-10-CM | POA: Diagnosis not present

## 2023-05-03 DIAGNOSIS — R051 Acute cough: Secondary | ICD-10-CM | POA: Diagnosis not present

## 2023-05-03 DIAGNOSIS — J069 Acute upper respiratory infection, unspecified: Secondary | ICD-10-CM | POA: Diagnosis not present

## 2023-05-03 DIAGNOSIS — I10 Essential (primary) hypertension: Secondary | ICD-10-CM | POA: Diagnosis not present

## 2023-05-11 ENCOUNTER — Other Ambulatory Visit: Payer: Self-pay | Admitting: Internal Medicine

## 2023-05-11 DIAGNOSIS — Z0000 Encounter for general adult medical examination without abnormal findings: Secondary | ICD-10-CM

## 2023-05-23 DIAGNOSIS — E7800 Pure hypercholesterolemia, unspecified: Secondary | ICD-10-CM | POA: Diagnosis not present

## 2023-05-23 DIAGNOSIS — D649 Anemia, unspecified: Secondary | ICD-10-CM | POA: Diagnosis not present

## 2023-05-23 DIAGNOSIS — Z0000 Encounter for general adult medical examination without abnormal findings: Secondary | ICD-10-CM | POA: Diagnosis not present

## 2023-05-23 DIAGNOSIS — R7989 Other specified abnormal findings of blood chemistry: Secondary | ICD-10-CM | POA: Diagnosis not present

## 2023-05-23 DIAGNOSIS — I10 Essential (primary) hypertension: Secondary | ICD-10-CM | POA: Diagnosis not present

## 2023-05-23 DIAGNOSIS — K219 Gastro-esophageal reflux disease without esophagitis: Secondary | ICD-10-CM | POA: Diagnosis not present

## 2023-05-30 DIAGNOSIS — Z0000 Encounter for general adult medical examination without abnormal findings: Secondary | ICD-10-CM | POA: Diagnosis not present

## 2023-05-30 DIAGNOSIS — L03039 Cellulitis of unspecified toe: Secondary | ICD-10-CM | POA: Diagnosis not present

## 2023-05-30 DIAGNOSIS — I10 Essential (primary) hypertension: Secondary | ICD-10-CM | POA: Diagnosis not present

## 2023-05-30 DIAGNOSIS — R053 Chronic cough: Secondary | ICD-10-CM | POA: Diagnosis not present

## 2023-05-30 DIAGNOSIS — E669 Obesity, unspecified: Secondary | ICD-10-CM | POA: Diagnosis not present

## 2023-05-30 DIAGNOSIS — F321 Major depressive disorder, single episode, moderate: Secondary | ICD-10-CM | POA: Diagnosis not present

## 2023-05-30 DIAGNOSIS — E7800 Pure hypercholesterolemia, unspecified: Secondary | ICD-10-CM | POA: Diagnosis not present

## 2023-05-30 DIAGNOSIS — R82998 Other abnormal findings in urine: Secondary | ICD-10-CM | POA: Diagnosis not present

## 2023-05-30 DIAGNOSIS — F17201 Nicotine dependence, unspecified, in remission: Secondary | ICD-10-CM | POA: Diagnosis not present

## 2023-06-20 ENCOUNTER — Ambulatory Visit: Payer: Managed Care, Other (non HMO)

## 2023-06-22 ENCOUNTER — Ambulatory Visit: Payer: Managed Care, Other (non HMO)

## 2023-06-24 ENCOUNTER — Ambulatory Visit
Admission: RE | Admit: 2023-06-24 | Discharge: 2023-06-24 | Disposition: A | Discharge status: Home / Self Care | Payer: Medicare HMO | Source: Ambulatory Visit | Attending: Internal Medicine | Admitting: Internal Medicine

## 2023-06-24 DIAGNOSIS — Z0000 Encounter for general adult medical examination without abnormal findings: Secondary | ICD-10-CM

## 2023-06-24 DIAGNOSIS — Z1231 Encounter for screening mammogram for malignant neoplasm of breast: Secondary | ICD-10-CM | POA: Diagnosis not present

## 2023-07-06 DIAGNOSIS — S76112A Strain of left quadriceps muscle, fascia and tendon, initial encounter: Secondary | ICD-10-CM | POA: Diagnosis not present

## 2023-07-06 DIAGNOSIS — M25562 Pain in left knee: Secondary | ICD-10-CM | POA: Diagnosis not present

## 2023-07-28 DIAGNOSIS — M545 Low back pain, unspecified: Secondary | ICD-10-CM | POA: Diagnosis not present

## 2023-08-03 ENCOUNTER — Other Ambulatory Visit: Payer: Self-pay | Admitting: Registered Nurse

## 2023-08-03 ENCOUNTER — Encounter: Payer: Self-pay | Admitting: Registered Nurse

## 2023-08-03 ENCOUNTER — Ambulatory Visit
Admission: RE | Admit: 2023-08-03 | Discharge: 2023-08-03 | Disposition: A | Discharge status: Home / Self Care | Payer: Medicare HMO | Source: Ambulatory Visit | Attending: Registered Nurse | Admitting: Registered Nurse

## 2023-08-03 DIAGNOSIS — E279 Disorder of adrenal gland, unspecified: Secondary | ICD-10-CM | POA: Diagnosis not present

## 2023-08-03 DIAGNOSIS — R3129 Other microscopic hematuria: Secondary | ICD-10-CM | POA: Diagnosis not present

## 2023-08-03 DIAGNOSIS — I700 Atherosclerosis of aorta: Secondary | ICD-10-CM | POA: Diagnosis not present

## 2023-08-03 DIAGNOSIS — M5451 Vertebrogenic low back pain: Secondary | ICD-10-CM | POA: Diagnosis not present

## 2023-08-03 DIAGNOSIS — N200 Calculus of kidney: Secondary | ICD-10-CM | POA: Diagnosis not present

## 2023-08-03 DIAGNOSIS — R109 Unspecified abdominal pain: Secondary | ICD-10-CM | POA: Diagnosis not present

## 2023-08-03 DIAGNOSIS — Z87442 Personal history of urinary calculi: Secondary | ICD-10-CM

## 2023-08-05 DIAGNOSIS — N200 Calculus of kidney: Secondary | ICD-10-CM | POA: Diagnosis not present

## 2023-08-05 DIAGNOSIS — D4412 Neoplasm of uncertain behavior of left adrenal gland: Secondary | ICD-10-CM | POA: Diagnosis not present

## 2023-08-08 ENCOUNTER — Other Ambulatory Visit: Payer: Self-pay | Admitting: Urology

## 2023-08-09 ENCOUNTER — Other Ambulatory Visit (HOSPITAL_COMMUNITY): Payer: Self-pay | Admitting: Urology

## 2023-08-09 DIAGNOSIS — N200 Calculus of kidney: Secondary | ICD-10-CM

## 2023-08-18 DIAGNOSIS — N200 Calculus of kidney: Secondary | ICD-10-CM | POA: Diagnosis not present

## 2023-08-23 NOTE — Progress Notes (Addendum)
COVID Vaccine Completed: yes  Date of COVID positive in last 90 days: no  PCP - Guerry Bruin, MD Cardiologist - n/a  Chest x-ray - yes last winter EKG - 08/24/23 Epic/chart Stress Test - 2010 per pt in [place], was ok per pt ECHO - n/a Cardiac Cath - n/a Pacemaker/ICD device last checked: n/a Spinal Cord Stimulator: n/a  Bowel Prep - no  Sleep Study - n/a CPAP -   Fasting Blood Sugar - n/a Checks Blood Sugar _____ times a day  Last dose of GLP1 agonist-  N/A GLP1 instructions:  N/A   Last dose of SGLT-2 inhibitors-  N/A SGLT-2 instructions: N/A   Blood Thinner Instructions:  Time Aspirin Instructions: ASA 81, hold 5 days Last Dose:  Activity level: Can go up a flight of stairs and perform activities of daily living without stopping and without symptoms of chest pain or shortness of breath. Difficulty with stairs due to knees  Anesthesia review: TIA, DVT, HTN,   Patient denies shortness of breath, fever, cough and chest pain at PAT appointment  Patient verbalized understanding of instructions that were given to them at the PAT appointment. Patient was also instructed that they will need to review over the PAT instructions again at home before surgery.

## 2023-08-23 NOTE — Patient Instructions (Addendum)
SURGICAL WAITING ROOM VISITATION  Patients having surgery or a procedure may have no more than 2 support people in the waiting area - these visitors may rotate.    Children under the age of 24 must have an adult with them who is not the patient.  Due to an increase in RSV and influenza rates and associated hospitalizations, children ages 19 and under may not visit patients in [facility] hospitals.  If the patient needs to stay at the hospital during part of their recovery, the visitor guidelines for inpatient rooms apply. Pre-op nurse will coordinate an appropriate time for 1 support person to accompany patient in pre-op.  This support person may not rotate.    Please refer to the [facility] website for the visitor guidelines for Inpatients (after your surgery is over and you are in a regular room).    Your procedure is scheduled on: 09/06/23   Report to [facility] Main Entrance    Report to admitting at 6:45 AM   Call this number if you have problems the morning of surgery [phone]   Do not eat food or drink liquids :After Midnight.          If you have questions, please contact your surgeon's office.   FOLLOW BOWEL PREP AND ANY ADDITIONAL PRE OP INSTRUCTIONS YOU RECEIVED FROM YOUR SURGEON'S OFFICE!!!     Oral Hygiene is also important to reduce your risk of infection.                                    Remember - BRUSH YOUR TEETH THE MORNING OF SURGERY WITH YOUR REGULAR TOOTHPASTE  DENTURES WILL BE REMOVED PRIOR TO SURGERY PLEASE DO NOT APPLY "Poly grip" OR ADHESIVES!!!   Stop all vitamins and herbal supplements 7 days before surgery.   Take these medicines the morning of surgery with A SIP OF WATER: Atorvastatin, Metoprolol, Omeprazole, Sertraline                               You may not have any metal on your body including hair pins, jewelry, and body piercing             Do not wear make-up, lotions, powders, perfumes, or deodorant  Do not wear nail  polish including gel and S&S, artificial/acrylic nails, or any other type of covering on natural nails including finger and toenails. If you have artificial nails, gel coating, etc. that needs to be removed by a nail salon please have this removed prior to surgery or surgery may need to be canceled/ delayed if the surgeon/ anesthesia feels like they are unable to be safely monitored.   Do not shave  48 hours prior to surgery.    Do not bring valuables to the hospital. [facility] IS NOT             RESPONSIBLE   FOR VALUABLES.   Contacts, glasses, dentures or bridgework may not be worn into surgery.   Bring small overnight bag day of surgery.   DO NOT BRING YOUR HOME MEDICATIONS TO THE HOSPITAL. PHARMACY WILL DISPENSE MEDICATIONS LISTED ON YOUR MEDICATION LIST TO YOU DURING YOUR ADMISSION IN THE HOSPITAL!   Special Instructions: Bring a copy of your healthcare power of attorney and living will documents the day of surgery if you haven't scanned them before.  Please read over the following fact sheets you were given: IF YOU HAVE QUESTIONS ABOUT YOUR PRE-OP INSTRUCTIONS PLEASE CALL [phone] [place]ras    If you received a COVID test during your pre-op visit  it is requested that you wear a mask when out in public, stay away from anyone that may not be feeling well and notify your surgeon if you develop symptoms. If you test positive for Covid or have been in contact with anyone that has tested positive in the last 10 days please notify you surgeon.    [facility] - Preparing for Surgery Before surgery, you can play an important role.  Because skin is not sterile, your skin needs to be as free of germs as possible.  You can reduce the number of germs on your skin by washing with CHG (chlorahexidine gluconate) soap before surgery.  CHG is an antiseptic cleaner which kills germs and bonds with the skin to continue killing germs even after washing. Please DO NOT use if you have an  allergy to CHG or antibacterial soaps.  If your skin becomes reddened/irritated stop using the CHG and inform your nurse when you arrive at Short Stay. Do not shave (including legs and underarms) for at least 48 hours prior to the first CHG shower.  You may shave your face/neck.  Please follow these instructions carefully:  1.  Shower with CHG Soap the night before surgery and the  morning of surgery.  2.  If you choose to wash your hair, wash your hair first as usual with your normal  shampoo.  3.  After you shampoo, rinse your hair and body thoroughly to remove the shampoo.                             4.  Use CHG as you would any other liquid soap.  You can apply chg directly to the skin and wash.  Gently with a scrungie or clean washcloth.  5.  Apply the CHG Soap to your body ONLY FROM THE NECK DOWN.   Do   not use on face/ open                           Wound or open sores. Avoid contact with eyes, ears mouth and   genitals (private parts).                       Wash face,  Genitals (private parts) with your normal soap.             6.  Wash thoroughly, paying special attention to the area where your    surgery  will be performed.  7.  Thoroughly rinse your body with warm water from the neck down.  8.  DO NOT shower/wash with your normal soap after using and rinsing off the CHG Soap.                9.  Pat yourself dry with a clean towel.            10.  Wear clean pajamas.            11.  Place clean sheets on your bed the night of your first shower and do not  sleep with pets. Day of Surgery : Do not apply any lotions/deodorants the morning of surgery.  Please wear clean clothes to the hospital/surgery  center.  FAILURE TO FOLLOW THESE INSTRUCTIONS MAY RESULT IN THE CANCELLATION OF YOUR SURGERY  PATIENT SIGNATURE_________________________________  NURSE SIGNATURE__________________________________  ________________________________________________________________________ WHAT IS A BLOOD  TRANSFUSION? Blood Transfusion Information  A transfusion is the replacement of blood or some of its parts. Blood is made up of multiple cells which provide different functions. Red blood cells carry oxygen and are used for blood loss replacement. White blood cells fight against infection. Platelets control bleeding. Plasma helps clot blood. Other blood products are available for specialized needs, such as hemophilia or other clotting disorders. BEFORE THE TRANSFUSION  Who gives blood for transfusions?  Healthy volunteers who are fully evaluated to make sure their blood is safe. This is blood bank blood. Transfusion therapy is the safest it has ever been in the practice of medicine. Before blood is taken from a donor, a complete history is taken to make sure that person has no history of diseases nor engages in risky social behavior (examples are intravenous drug use or sexual activity with multiple partners). The donor's travel history is screened to minimize risk of transmitting infections, such as malaria. The donated blood is tested for signs of infectious diseases, such as HIV and hepatitis. The blood is then tested to be sure it is compatible with you in order to minimize the chance of a transfusion reaction. If you or a relative donates blood, this is often done in anticipation of surgery and is not appropriate for emergency situations. It takes many days to process the donated blood. RISKS AND COMPLICATIONS Although transfusion therapy is very safe and saves many lives, the main dangers of transfusion include:  Getting an infectious disease. Developing a transfusion reaction. This is an allergic reaction to something in the blood you were given. Every precaution is taken to prevent this. The decision to have a blood transfusion has been considered carefully by your caregiver before blood is given. Blood is not given unless the benefits outweigh the risks. AFTER THE TRANSFUSION Right after  receiving a blood transfusion, you will usually feel much better and more energetic. This is especially true if your red blood cells have gotten low (anemic). The transfusion raises the level of the red blood cells which carry oxygen, and this usually causes an energy increase. The nurse administering the transfusion will monitor you carefully for complications. HOME CARE INSTRUCTIONS  No special instructions are needed after a transfusion. You may find your energy is better. Speak with your caregiver about any limitations on activity for underlying diseases you may have. SEEK MEDICAL CARE IF:  Your condition is not improving after your transfusion. You develop redness or irritation at the intravenous (IV) site. SEEK IMMEDIATE MEDICAL CARE IF:  Any of the following symptoms occur over the next 12 hours: Shaking chills. You have a temperature by mouth above 102 F (38.9 C), not controlled by medicine. Chest, back, or muscle pain. People around you feel you are not acting correctly or are confused. Shortness of breath or difficulty breathing. Dizziness and fainting. You get a rash or develop hives. You have a decrease in urine output. Your urine turns a dark color or changes to pink, red, or brown. Any of the following symptoms occur over the next 10 days: You have a temperature by mouth above 102 F (38.9 C), not controlled by medicine. Shortness of breath. Weakness after normal activity. The white part of the eye turns yellow (jaundice). You have a decrease in the amount of urine or are urinating less often.  Your urine turns a dark color or changes to pink, red, or brown. Document Released: 12/03/2000 Document Revised: 02/28/2012 Document Reviewed: 07/22/2008 [facility] Patient Information 2014 [place], [place].  _______________________________________________________________________

## 2023-08-24 ENCOUNTER — Encounter (HOSPITAL_COMMUNITY): Payer: Self-pay

## 2023-08-24 ENCOUNTER — Encounter (HOSPITAL_COMMUNITY)
Admission: RE | Admit: 2023-08-24 | Discharge: 2023-08-24 | Disposition: A | Discharge status: Home / Self Care | Payer: Medicare HMO | Source: Ambulatory Visit | Attending: Urology | Admitting: Urology

## 2023-08-24 ENCOUNTER — Other Ambulatory Visit: Payer: Self-pay

## 2023-08-24 VITALS — BP 126/88 | HR 61 | Temp 98.3°F | Ht 65.0 in | Wt 170.0 lb

## 2023-08-24 DIAGNOSIS — Z01812 Encounter for preprocedural laboratory examination: Secondary | ICD-10-CM | POA: Insufficient documentation

## 2023-08-24 DIAGNOSIS — Z01810 Encounter for preprocedural cardiovascular examination: Secondary | ICD-10-CM | POA: Insufficient documentation

## 2023-08-24 DIAGNOSIS — I10 Essential (primary) hypertension: Secondary | ICD-10-CM | POA: Diagnosis present

## 2023-08-24 DIAGNOSIS — I119 Hypertensive heart disease without heart failure: Secondary | ICD-10-CM | POA: Diagnosis not present

## 2023-08-24 DIAGNOSIS — I517 Cardiomegaly: Secondary | ICD-10-CM | POA: Diagnosis present

## 2023-08-24 DIAGNOSIS — Z01818 Encounter for other preprocedural examination: Secondary | ICD-10-CM | POA: Diagnosis present

## 2023-08-24 HISTORY — DX: Pneumonia, unspecified organism: J18.9

## 2023-08-24 HISTORY — DX: Personal history of urinary calculi: Z87.442

## 2023-08-24 HISTORY — DX: Unspecified osteoarthritis, unspecified site: M19.90

## 2023-08-24 LAB — CBC
HCT: 38.3 % (ref 36.0–46.0)
Hemoglobin: 12.2 g/dL (ref 12.0–15.0)
MCH: 31.5 pg (ref 26.0–34.0)
MCHC: 31.9 g/dL (ref 30.0–36.0)
MCV: 99 fL (ref 80.0–100.0)
Platelets: 230 10*3/uL (ref 150–400)
RBC: 3.87 MIL/uL (ref 3.87–5.11)
RDW: 12.7 % (ref 11.5–15.5)
WBC: 6.9 10*3/uL (ref 4.0–10.5)
nRBC: 0 % (ref 0.0–0.2)

## 2023-08-24 LAB — BASIC METABOLIC PANEL
Anion gap: 9 (ref 5–15)
BUN: 34 mg/dL — ABNORMAL HIGH (ref 8–23)
CO2: 24 mmol/L (ref 22–32)
Calcium: 9.9 mg/dL (ref 8.9–10.3)
Chloride: 102 mmol/L (ref 98–111)
Creatinine, Ser: 0.95 mg/dL (ref 0.44–1.00)
GFR, Estimated: >60 mL/min (ref >60)
Glucose, Bld: 101 mg/dL — ABNORMAL HIGH (ref 70–99)
Potassium: 4.4 mmol/L (ref 3.5–5.1)
Sodium: 135 mmol/L (ref 135–145)

## 2023-08-24 LAB — TYPE AND SCREEN
ABO/RH(D): O POS
Antibody Screen: NEGATIVE

## 2023-08-31 DIAGNOSIS — H35033 Hypertensive retinopathy, bilateral: Secondary | ICD-10-CM | POA: Diagnosis not present

## 2023-08-31 DIAGNOSIS — H353132 Nonexudative age-related macular degeneration, bilateral, intermediate dry stage: Secondary | ICD-10-CM | POA: Diagnosis not present

## 2023-08-31 DIAGNOSIS — H524 Presbyopia: Secondary | ICD-10-CM | POA: Diagnosis not present

## 2023-08-31 DIAGNOSIS — H16223 Keratoconjunctivitis sicca, not specified as Sjogren's, bilateral: Secondary | ICD-10-CM | POA: Diagnosis not present

## 2023-08-31 DIAGNOSIS — H04123 Dry eye syndrome of bilateral lacrimal glands: Secondary | ICD-10-CM | POA: Diagnosis not present

## 2023-09-02 ENCOUNTER — Other Ambulatory Visit: Payer: Self-pay | Admitting: Radiology

## 2023-09-02 DIAGNOSIS — N200 Calculus of kidney: Secondary | ICD-10-CM

## 2023-09-05 ENCOUNTER — Ambulatory Visit (HOSPITAL_COMMUNITY)
Admission: RE | Admit: 2023-09-05 | Discharge: 2023-09-05 | Disposition: A | Discharge status: Home / Self Care | Payer: Medicare HMO | Source: Ambulatory Visit | Attending: Urology | Admitting: Urology

## 2023-09-05 ENCOUNTER — Encounter (HOSPITAL_COMMUNITY): Payer: Self-pay

## 2023-09-05 ENCOUNTER — Other Ambulatory Visit: Payer: Self-pay

## 2023-09-05 DIAGNOSIS — Z8673 Personal history of transient ischemic attack (TIA), and cerebral infarction without residual deficits: Secondary | ICD-10-CM | POA: Diagnosis not present

## 2023-09-05 DIAGNOSIS — K219 Gastro-esophageal reflux disease without esophagitis: Secondary | ICD-10-CM | POA: Insufficient documentation

## 2023-09-05 DIAGNOSIS — N200 Calculus of kidney: Secondary | ICD-10-CM | POA: Diagnosis present

## 2023-09-05 DIAGNOSIS — Z86718 Personal history of other venous thrombosis and embolism: Secondary | ICD-10-CM | POA: Insufficient documentation

## 2023-09-05 DIAGNOSIS — Z90710 Acquired absence of both cervix and uterus: Secondary | ICD-10-CM | POA: Diagnosis not present

## 2023-09-05 DIAGNOSIS — Z8349 Family history of other endocrine, nutritional and metabolic diseases: Secondary | ICD-10-CM | POA: Diagnosis not present

## 2023-09-05 DIAGNOSIS — Z79899 Other long term (current) drug therapy: Secondary | ICD-10-CM | POA: Diagnosis not present

## 2023-09-05 DIAGNOSIS — Z8249 Family history of ischemic heart disease and other diseases of the circulatory system: Secondary | ICD-10-CM | POA: Diagnosis not present

## 2023-09-05 DIAGNOSIS — Z880 Allergy status to penicillin: Secondary | ICD-10-CM | POA: Diagnosis not present

## 2023-09-05 DIAGNOSIS — I10 Essential (primary) hypertension: Secondary | ICD-10-CM | POA: Diagnosis present

## 2023-09-05 DIAGNOSIS — E785 Hyperlipidemia, unspecified: Secondary | ICD-10-CM | POA: Diagnosis present

## 2023-09-05 DIAGNOSIS — G479 Sleep disorder, unspecified: Secondary | ICD-10-CM | POA: Diagnosis present

## 2023-09-05 DIAGNOSIS — F32A Depression, unspecified: Secondary | ICD-10-CM | POA: Diagnosis present

## 2023-09-05 DIAGNOSIS — Z87891 Personal history of nicotine dependence: Secondary | ICD-10-CM | POA: Diagnosis not present

## 2023-09-05 DIAGNOSIS — Z7982 Long term (current) use of aspirin: Secondary | ICD-10-CM | POA: Diagnosis not present

## 2023-09-05 DIAGNOSIS — I700 Atherosclerosis of aorta: Secondary | ICD-10-CM | POA: Diagnosis present

## 2023-09-05 DIAGNOSIS — E7800 Pure hypercholesterolemia, unspecified: Secondary | ICD-10-CM | POA: Diagnosis present

## 2023-09-05 DIAGNOSIS — E278 Other specified disorders of adrenal gland: Secondary | ICD-10-CM | POA: Diagnosis present

## 2023-09-05 DIAGNOSIS — Z888 Allergy status to other drugs, medicaments and biological substances status: Secondary | ICD-10-CM | POA: Diagnosis not present

## 2023-09-05 DIAGNOSIS — N201 Calculus of ureter: Secondary | ICD-10-CM | POA: Diagnosis not present

## 2023-09-05 DIAGNOSIS — Z833 Family history of diabetes mellitus: Secondary | ICD-10-CM | POA: Diagnosis not present

## 2023-09-05 DIAGNOSIS — H9192 Unspecified hearing loss, left ear: Secondary | ICD-10-CM | POA: Diagnosis present

## 2023-09-05 DIAGNOSIS — N809 Endometriosis, unspecified: Secondary | ICD-10-CM | POA: Diagnosis present

## 2023-09-05 DIAGNOSIS — Z882 Allergy status to sulfonamides status: Secondary | ICD-10-CM | POA: Diagnosis not present

## 2023-09-05 HISTORY — PX: IR URETERAL STENT LEFT NEW ACCESS W/O SEP NEPHROSTOMY CATH: IMG6075

## 2023-09-05 LAB — PROTIME-INR
INR: 1 (ref 0.8–1.2)
Prothrombin Time: 13.1 s (ref 11.4–15.2)

## 2023-09-05 LAB — CBC WITH DIFFERENTIAL/PLATELET
Abs Immature Granulocytes: 0.03 K/uL (ref 0.00–0.07)
Basophils Absolute: 0 K/uL (ref 0.0–0.1)
Basophils Relative: 1 %
Eosinophils Absolute: 0.3 K/uL (ref 0.0–0.5)
Eosinophils Relative: 4 %
HCT: 36.8 % (ref 36.0–46.0)
Hemoglobin: 11.9 g/dL — ABNORMAL LOW (ref 12.0–15.0)
Immature Granulocytes: 1 %
Lymphocytes Relative: 23 %
Lymphs Abs: 1.4 K/uL (ref 0.7–4.0)
MCH: 32.2 pg (ref 26.0–34.0)
MCHC: 32.3 g/dL (ref 30.0–36.0)
MCV: 99.7 fL (ref 80.0–100.0)
Monocytes Absolute: 0.8 K/uL (ref 0.1–1.0)
Monocytes Relative: 13 %
Neutro Abs: 3.5 K/uL (ref 1.7–7.7)
Neutrophils Relative %: 58 %
Platelets: 173 K/uL (ref 150–400)
RBC: 3.69 MIL/uL — ABNORMAL LOW (ref 3.87–5.11)
RDW: 12.6 % (ref 11.5–15.5)
WBC: 6 K/uL (ref 4.0–10.5)
nRBC: 0 % (ref 0.0–0.2)

## 2023-09-05 LAB — BASIC METABOLIC PANEL WITH GFR
Anion gap: 10 (ref 5–15)
BUN: 17 mg/dL (ref 8–23)
CO2: 25 mmol/L (ref 22–32)
Calcium: 9.1 mg/dL (ref 8.9–10.3)
Chloride: 102 mmol/L (ref 98–111)
Creatinine, Ser: 0.75 mg/dL (ref 0.44–1.00)
GFR, Estimated: >60 mL/min
Glucose, Bld: 94 mg/dL (ref 70–99)
Potassium: 4.1 mmol/L (ref 3.5–5.1)
Sodium: 137 mmol/L (ref 135–145)

## 2023-09-05 MED ORDER — SODIUM CHLORIDE 0.9 % IV SOLN: INTRAVENOUS | Status: DC

## 2023-09-05 MED ORDER — MIDAZOLAM HCL 2 MG/2ML IJ SOLN
INTRAMUSCULAR | Status: AC | PRN
Start: 2023-09-05 — End: 2023-09-05
  Administered 2023-09-05: 2 mg via INTRAVENOUS
  Administered 2023-09-05 (×4): 1 mg via INTRAVENOUS

## 2023-09-05 MED ORDER — HEPARIN SOD (PORK) LOCK FLUSH 100 UNIT/ML IV SOLN: 500.0000 [IU] | Freq: Once | INTRAVENOUS | Status: DC

## 2023-09-05 MED ORDER — DIPHENHYDRAMINE HCL 50 MG/ML IJ SOLN
INTRAMUSCULAR | Status: AC
  Filled 2023-09-05: qty 1

## 2023-09-05 MED ORDER — MIDAZOLAM HCL 2 MG/2ML IJ SOLN
INTRAMUSCULAR | Status: AC
  Filled 2023-09-05: qty 2

## 2023-09-05 MED ORDER — LIDOCAINE-EPINEPHRINE 1 %-1:100000 IJ SOLN
INTRAMUSCULAR | Status: AC
  Filled 2023-09-05: qty 1

## 2023-09-05 MED ORDER — FENTANYL CITRATE (PF) 100 MCG/2ML IJ SOLN
INTRAMUSCULAR | Status: AC | PRN
Start: 2023-09-05 — End: 2023-09-05
  Administered 2023-09-05 (×2): 50 ug via INTRAVENOUS

## 2023-09-05 MED ORDER — LEVOFLOXACIN IN D5W 500 MG/100ML IV SOLN
500.0000 mg | Freq: Once | INTRAVENOUS | Status: AC
  Administered 2023-09-05: 500 mg via INTRAVENOUS
  Filled 2023-09-05: qty 100

## 2023-09-05 MED ORDER — MIDAZOLAM HCL 2 MG/2ML IJ SOLN
INTRAMUSCULAR | Status: AC
  Filled 2023-09-05: qty 4

## 2023-09-05 MED ORDER — FENTANYL CITRATE (PF) 100 MCG/2ML IJ SOLN
INTRAMUSCULAR | Status: AC
  Filled 2023-09-05: qty 2

## 2023-09-05 MED ORDER — SODIUM CHLORIDE 0.9% FLUSH: 5.0000 mL | Freq: Three times a day (TID) | INTRAVENOUS | Status: DC

## 2023-09-05 MED ORDER — IOHEXOL 300 MG/ML  SOLN
50.0000 mL | Freq: Once | INTRAMUSCULAR | Status: AC | PRN
  Administered 2023-09-05: 20 mL

## 2023-09-05 MED ORDER — DIPHENHYDRAMINE HCL 50 MG/ML IJ SOLN
INTRAMUSCULAR | Status: AC | PRN
Start: 2023-09-05 — End: 2023-09-05
  Administered 2023-09-05: 25 mg via INTRAVENOUS

## 2023-09-05 MED ORDER — LIDOCAINE-EPINEPHRINE 1 %-1:100000 IJ SOLN
20.0000 mL | Freq: Once | INTRAMUSCULAR | Status: AC
  Administered 2023-09-05: 8 mL via INTRADERMAL

## 2023-09-05 NOTE — H&P (Signed)
I have kidney stones.  HPI: Teresa Lopez is a 67 year-old female patient who was referred by Dr. Gaspar Garbe, MD who is here for renal calculi.  The problem is on the left side.   08/05/23: Teresa Lopez is a 67 yo female with a history of stones and a prior ureteroscopy with stent in [place] several years ago. She had a parathyroidectomy in 2015. Her recent calciums have been normal. She presents now with left flank pain that began about 3 weeks ago. The pain is intermittently severe and she has not been able to work. She has had no nausea or hematuria. Her UA is clear. She had a film at [facility] because of her DDD and back pain and a stone was seen. She subsequently had a CT that showed a left upper pole partial staghorn stone with a 19mm component obstructing the upper pole infundibulum along with severe other stones. 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 Her BP has been up recently and is 171/106 today.      ALLERGIES: No Allergies    MEDICATIONS: Aspirin 81 mg tablet,chewable  Lisinopril 10 mg tablet  Metoprolol Succinate 50 mg tablet, extended release 24 hr  Omeprazole 20 mg capsule,delayed release  Atorvastatin Calcium 20 mg tablet  Icaps Areds  Meloxicam 15 mg tablet  Multivitamin  Sertraline Hcl 100 mg tablet  Turmeric  Vitamin C  Vitamin D3     GU PSH: None     PSH Notes: Shoulder, kidney stone, parathyroid   NON-GU PSH: Cataract surgery - 2022 Hysterectomy Tonsillectomy     GU PMH: None     PMH Notes: Blood clot in leg/ DVT  Blood clot in lung/ PE at age20.  Brain aneuryms with clipping 2018.    NON-GU PMH: Arthritis Depression GERD Hypercholesterolemia Hypertension Stroke/TIA    FAMILY HISTORY: Brain Cancer - Mother Breast Cancer - Sister Death In The Family Father - Other Death In The Family Mother - Other Vocal cord cancer - Father   SOCIAL HISTORY: Marital Status: Single Preferred Language: English;  Ethnicity: Not Hispanic Or Latino; Race: White Current Smoking Status: Patient does not smoke anymore. Has not smoked since 07/21/2019. Smoked for 30 years.   Tobacco Use Assessment Completed: Used Tobacco in last 30 days? Does not use smokeless tobacco. Drinks 1 drink per week.  Does not use drugs. Drinks 1 caffeinated drink per day. Patient's occupation Therapist, occupational at [employer]    REVIEW OF SYSTEMS:    GU Review Female:   Patient reports get up at night to urinate. Patient denies frequent urination, hard to postpone urination, burning /pain with urination, leakage of urine, stream starts and stops, trouble starting your stream, have to strain to urinate, and being pregnant.  Gastrointestinal (Upper):   Patient denies nausea, vomiting, and indigestion/ heartburn.  Gastrointestinal (Lower):   Patient reports diarrhea and constipation.   Constitutional:   Patient reports night sweats and fatigue. Patient denies fever and weight loss.  Skin:   Patient denies skin rash/ lesion and itching.  Eyes:   Patient denies blurred vision and double vision.  Ears/ Nose/ Throat:   Patient reports sinus problems. Patient denies sore throat.  Hematologic/Lymphatic:   Patient reports easy bruising. Patient denies swollen glands.  Cardiovascular:   Patient reports leg swelling. Patient denies chest pains.  Respiratory:   Patient denies cough and shortness of breath.  Endocrine:   Patient denies excessive thirst.  Musculoskeletal:  Patient reports back pain and joint pain.   Neurological:   Patient denies headaches and dizziness.  Psychologic:   Patient reports depression. Patient denies anxiety.   VITAL SIGNS:      08/05/2023 09:56 AM  Weight 175 lb / 79.38 kg  Height 65 in / 165.1 cm  BP 171/106 mmHg  Heart Rate 60 /min  Temperature 97.8 F / 36.5 C  BMI 29.1 kg/m   MULTI-SYSTEM PHYSICAL EXAMINATION:    Constitutional: Well-nourished. No physical deformities. Normally developed.  Good grooming.  Neck: Neck symmetrical, not swollen. Normal tracheal position.  Respiratory: Normal breath sounds. No labored breathing, no use of accessory muscles.   Cardiovascular: Regular rate and rhythm. No murmur, no gallop.   Skin: No paleness, no jaundice, no cyanosis. No lesion, no ulcer, no rash.  Neurologic / Psychiatric: Oriented to time, oriented to place, oriented to person. No depression, no anxiety, no agitation.  Gastrointestinal: No mass, no tenderness, no rigidity, non obese abdomen.   Musculoskeletal: Normal gait and station of head and neck.     Complexity of Data:  Lab Test Review:   CMP  Records Review:   Previous Doctor Records  Urine Test Review:   Urinalysis  X-Ray Review: [facility] Protocol: Reviewed Films. Reviewed Report. Discussed With Patient.     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 She will need a PCNL for management.   I sent hydrocodone for the pain.   I reviewed the risks of bleeding, infection, injury to the kidney and adjacent structures, renal loss, need for secondary procedures, urine leaks, thrombotic events and anesthetic complications.    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   4   Hypertension - I10 Chronic, Worsening - Her BP has been up recently. I will have her stop the meloxicam  and this will need to be addressed by Dr. Wylene Simmer.    PLAN:            Medications New Meds: Hydrocodone-Acetaminophen 5 mg-325 mg tablet 1 tablet PO Q 6 H   #12  0 Refill(s)  Pharm[facility] Name:  [facility] [id number]  Address:  [address]   [place], [place] [address]  Phone:  [phone]  Fax:  [phone]            Schedule Return Visit/Planned Activity: ASAP - Schedule Surgery  Procedure: Unspecified Date - PCNL - 50080, left Notes: Next available.           Document Letter(s):  Created for Patient: Clinical Summary

## 2023-09-05 NOTE — H&P (Signed)
Chief Complaint: Patient was seen in consultation today for left renal calculus.  Referring Physician(s): Bjorn Pippin  Supervising Physician: Marliss Coots  Patient Status: [facility] - Out-pt  History of Present Illness: Teresa Lopez is a 67 y.o. female with a past medical history significant for depression, GERD, HTN, HLD, DVT, cerebral aneurysm s/p stent placement 2018, history of multiple renal stones with previous ureteroscopy and stent placement and left staghorn calculus who presents today for left percutaneous nephrostomy placement prior to undergoing percutaneous nephrolithotomy on 9/17. Teresa Lopez began to experience intermittent severe left flank pain in July of this year and was seen by orthopedics for presumed back pain, a plain film was obtained which showed large renal stones. Teresa Lopez then underwent CT renal stone study 08/03/23 which showed:    1. Clustered staghorn calculi in a dilated left kidney upper pole calyceal system with likely chronic obstruction due to infundibular extension of one of the staghorn calculi. Overlying cortical thinning compatible with chronic scarring. 2. Nonobstructive right nephrolithiasis. 3. Subtle stranding in the small bowel mesentery in the central abdomen, possibly indicative of a low-grade inflammatory process, but without obvious abnormality of the adjacent bowel. 4. Aortic atherosclerosis. 5. Subcentimeter pulmonary nodules in the lung bases  Teresa Lopez is planned to undergo nephrolithotomy tomorrow with Dr. Annabell Howells and IR has been consulted for placement of left percutaneous nephrostomy for procedural access.  Past Medical History:  Diagnosis Date   Arthritis    Cerebral aneurysm    s/p stent x 2 12/2016   Deaf, left    Depression    DVT (deep venous thrombosis) (HCC)    age 61   GERD (gastroesophageal reflux disease)    History of kidney stones    Hyperlipidemia    Hypertension    Pneumonia    as child   Sleep disorder    snores    TIA (transient ischemic attack) 11/2016    Past Surgical History:  Procedure Laterality Date   ABDOMINAL HYSTERECTOMY     CATARACT EXTRACTION Bilateral    kidney stones     laser surgery     x 3- endometriosis   PARATHYROIDECTOMY     TONSILLECTOMY     x 2    Allergies: Codeine, Sulfa antibiotics, and Penicillins  Medications: Prior to Admission medications   Medication Sig Start Date End Date Taking? Authorizing Provider  Ascorbic Acid (VITAMIN C) 1000 MG tablet Take 1,000 mg by mouth daily.   Yes [provider]  aspirin EC 81 MG tablet Take 162 mg by mouth at bedtime. Swallow whole.   Yes [provider]  atorvastatin (LIPITOR) 20 MG tablet Take 20 mg by mouth daily.   Yes [provider]  Cholecalciferol (VITAMIN D3) 1.25 MG (50000 UT) TABS Take 50,000 Units by mouth 2 (two) times a week.   Yes [provider]  lisinopril (ZESTRIL) 10 MG tablet Take 10 mg by mouth in the morning and at bedtime.   Yes [provider]  metoprolol tartrate (LOPRESSOR) 50 MG tablet Take 50 mg by mouth 2 (two) times daily.   Yes [provider]  Multiple Vitamin (MULTIVITAMIN WITH MINERALS) TABS tablet Take 1 tablet by mouth daily.   Yes [provider]  Multiple Vitamins-Minerals (PRESERVISION AREDS 2) CAPS Take 1 capsule by mouth in the morning and at bedtime.   Yes [provider]  omeprazole (PRILOSEC) 20 MG capsule Take 20 mg by mouth 2 (two) times daily before a meal.   Yes [provider]  sertraline (ZOLOFT) 100 MG tablet Take 150 mg by mouth daily.   Yes [provider]  TURMERIC PO Take 1 capsule by mouth daily.   Yes [provider]  polyvinyl alcohol (LIQUIFILM TEARS) 1.4 % ophthalmic solution Place 1 drop into both eyes as needed for dry eyes.    [provider]     Family History  Problem Relation Age of Onset   Breast cancer Sister 77       second time at 43    Cancer Mother         brain   Thyroid disease Mother    Heart disease Father    Cancer Father    Diabetes Maternal Grandmother    Cancer Paternal Grandmother    Hypothyroidism Sister    Other Brother        cerebral meningitis    Social History   Socioeconomic History   Marital status: Single    Spouse name: Not on file   Number of children: 1   Years of education: Not on file   Highest education level: High school graduate  Occupational History    Comment: Education officer, environmental  Tobacco Use   Smoking status: Former    Current packs/day: 0.00    Average packs/day: 1 pack/day for 45.0 years (45.0 ttl pk-yrs)    Types: Cigarettes    Start date: 01/12/1974    Quit date: 01/12/2019    Years since quitting: 4.6   Smokeless tobacco: Never  Vaping Use   Vaping status: Never Used  Substance and Sexual Activity   Alcohol use: Yes    Alcohol/week: 10.0 standard drinks of alcohol    Types: 10 Standard drinks or equivalent per week    Comment: socially   Drug use: Never   Sexual activity: Not on file  Other Topics Concern   Not on file  Social History Narrative   Caffeine- coffee maybe 1 c daily   Social Determinants of Health   Financial Resource Strain: Not on file  Food Insecurity: Not on file  Transportation Needs: Not on file  Physical Activity: Not on file  Stress: Not on file  Social Connections: Not on file     Review of Systems: A 12 point ROS discussed and pertinent positives are indicated in the HPI above.  All other systems are negative.  Review of Systems  Constitutional:  Negative for chills and fever.  Respiratory:  Negative for cough and shortness of breath.   Cardiovascular:  Negative for chest pain.  Gastrointestinal:  Negative for abdominal pain, nausea and vomiting.  Genitourinary:  Positive for flank pain (left). Negative for dysuria and hematuria.  Musculoskeletal:  Positive for back pain.    Vital Signs: BP 109/61   Pulse 75   Temp (!) 97.4 F (36.3 C)  (Oral)   Resp 14   Ht 5\' 5"  (1.651 m)   Wt 170 lb (77.1 kg)   SpO2 94%   BMI 28.29 kg/m   Physical Exam Vitals reviewed.  Constitutional:      General: Teresa Lopez is not in acute distress. HENT:     Head: Normocephalic.     Mouth/Throat:     Mouth: Mucous membranes are moist.     Pharynx: Oropharynx is clear. No oropharyngeal exudate or posterior oropharyngeal erythema.  Cardiovascular:     Rate and Rhythm: Normal rate and regular rhythm.  Pulmonary:     Effort: Pulmonary effort is normal.  Breath sounds: Normal breath sounds.  Abdominal:     Palpations: Abdomen is soft.  Skin:    General: Skin is warm and dry.  Neurological:     Mental Status: Teresa Lopez is alert and oriented to person, place, and time.  Psychiatric:        Mood and Affect: Mood normal.        Behavior: Behavior normal.        Thought Content: Thought content normal.        Judgment: Judgment normal.      MD Evaluation Airway: WNL Heart: WNL Abdomen: WNL Chest/ Lungs: WNL ASA  Classification: 2 Mallampati/Airway Score: One   Imaging: No results found.  Labs:  CBC: Recent Labs    08/24/23 0938 09/05/23 1300  WBC 6.9 6.0  HGB 12.2 11.9*  HCT 38.3 36.8  PLT 230 173    COAGS: Recent Labs    09/05/23 1300  INR 1.0    BMP: Recent Labs    08/24/23 0938 09/05/23 1300  NA 135 137  K 4.4 4.1  CL 102 102  CO2 24 25  GLUCOSE 101* 94  BUN 34* 17  CALCIUM 9.9 9.1  CREATININE 0.95 0.75  GFRNONAA >60 >60    LIVER FUNCTION TESTS: No results for input(s): "BILITOT", "AST", "ALT", "ALKPHOS", "PROT", "ALBUMIN" in the last 8760 hours.  TUMOR MARKERS: No results for input(s): "AFPTM", "CEA", "CA199", "CHROMGRNA" in the last 8760 hours.  Assessment and Plan:  67 y/o F with history of left renal staghorn calculus planned for percutaneous nephrolithotomy tomorrow with urology who presents today for left PCN placement for procedural access.  Risks and benefits of left PCN placement was  discussed with the patient including, but not limited to, infection, bleeding, significant bleeding causing loss or decrease in renal function or damage to adjacent structures.   All of the patient's questions were answered, patient is agreeable to proceed.  Consent signed and in chart.  Thank you for this interesting consult.  I greatly enjoyed meeting Teresa Lopez and look forward to participating in their care.  A copy of this report was sent to the requesting provider on this date.  Electronically Signed: Villa Herb, PA-C 09/05/2023, 1:58 PM   I spent a total of 30 Minutes in face to face in clinical consultation, greater than 50% of which was counseling/coordinating care for left renal calculus.

## 2023-09-05 NOTE — Procedures (Signed)
Interventional Radiology Procedure Note  Procedure: Left percutaneous nephrostomy placement  Findings: Please refer to procedural dictation for full description. 8 Fr left upper pole moiety multipurpose drain placed.    Complications: None immediate  Estimated Blood Loss: < 5 mL  Recommendations: To bag drainage until forthcoming PCNL.   Marliss Coots, MD

## 2023-09-05 NOTE — Anesthesia Preprocedure Evaluation (Signed)
Anesthesia Evaluation  Patient identified by MRN, date of birth, ID band Patient awake    Reviewed: Allergy & Precautions, NPO status , Patient's Chart, lab work & pertinent test results  Airway Mallampati: II  TM Distance: >3 FB Neck ROM: Full    Dental no notable dental hx. 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   GI/Hepatic ,GERD  ,,  Endo/Other    Renal/GU Renal diseaseLab Results      Component                Value               Date                            K                        4.1                 09/05/2023                CREATININE               0.75                09/05/2023          Nephrolithiasis- Staghorn     Musculoskeletal  (+) Arthritis ,    Abdominal   Peds  Hematology Lab Results      Component                Value               Date                      WBC                      6.0                 09/05/2023                HGB                      11.9 (L)            09/05/2023                HCT                      36.8                09/05/2023                MCV                      99.7                09/05/2023                PLT                      173  09/05/2023              Anesthesia Other Findings All: sulfa, pcn, codeine  Reproductive/Obstetrics                             Anesthesia Physical Anesthesia Plan  ASA: 3  Anesthesia Plan: General   Post-op Pain Management:    Induction: Intravenous  PONV Risk Score and Plan: 4 or greater and Treatment may vary due to age or medical condition, Midazolam and Ondansetron  Airway Management Planned: Oral ETT  Additional Equipment: None  Intra-op Plan:    Post-operative Plan: Extubation in OR  Informed Consent: I have reviewed the patients History and Physical, chart, labs and discussed the procedure including the risks, benefits and alternatives for the proposed anesthesia with the patient or authorized representative who has indicated his/her understanding and acceptance.     Dental advisory given  Plan Discussed with: CRNA and Anesthesiologist  Anesthesia Plan Comments:         Anesthesia Quick Evaluation

## 2023-09-05 NOTE — Discharge Instructions (Addendum)
Please call Interventional Radiology clinic [phone] with any questions or concerns.  Leave dressing in place and report for surgery tomorrow. Do not eat or  drink after midnight and follow the instructions from Presurgical testing  After the procedure, it is common to have: Some soreness where the nephrostomy tube was inserted (tube insertion site) Blood-tinged drainage from the nephrostomy tube for the first 24 hours  Follow these instructions at home:  Medication: Do not use Aspirin or ibuprofen products, such as Advil or Motrin, as it may increase bleeding.  You may resume your usual medications as ordered by your doctor If your doctor prescribed antibiotics, take them as directed. Do not stop taking them just because you feel better. You need to take the full course of antibiotics.  Eating and drinking: Drink plenty of liquids to keep your urine pale yellow You can resume your regular diet as directed by your doctor  Care of the procedure site Follow instructions from your health care provider about how to take care of your tube insertion site. Make sure you: Wash your hands with soap and water for at least 20 seconds before you change your dressing. If soap and water are not available, use hand sanitizer. Change your dressing as told by your health care provider. Be careful not to pull on the tube while removing the dressing When you change the dressing, wash the skin around the tube, rinse well, and pat the skin dry Avoid using scissors to remove the dressing. Sharp objects may damage the catheter Check the tube insertion area every day for signs of infection. Check for: Redness, swelling, or pain Fluid or blood Warmth Pus or a bad smell Care of the nephrostomy tube and drainage bag Always keep the tubing, the leg bag, or the bedside drainage bags below the level of the kidney so that your urine drains freely When connecting your nephrostomy tube to a drainage bag, make  sure that there are no kinks in the tubing and that your urine is draining freely. You may want to use an elastic bandage to wrap any exposed tubing that goes from the nephrostomy tube to any of the connecting tubes. At night, you may want to connect your nephrostomy tube or the leg bag to a larger bedside drainage bag Follow instructions from your health care provider about how to empty or change the drainage bag Empty the drainage bag when it becomes ? full Replace the drainage bag and any extension tubing that is connected to your nephrostomy tube every 7 days or as told by your health care provider. Your health care provider will explain how to change the drainage bag and extension tubing. The nephrostomy tube will need to be changed every 8-12 weeks   Activity Do not lift anything that is heavier than 10 lb (4.5 kg), or the limit that you are told, until your health care provider says that it is safe Return to your normal activities as told by your health care provider.  Avoid activities that may cause the nephrostomy tubing to bend Do not take baths, swim, or use a hot tub until your health care provider approves. Ask your health care provider if you can take showers.  Cover the nephrostomy tube bandage (dressing) with a watertight covering when you take a shower Keep all follow-up visits as told by your doctor  Contact a health care provider if: You have problems with any of the valves or tubing You have persistent pain or soreness in  your back You have redness, swelling, or pain around your tube insertion site You have fluid or blood coming from your tube insertion site Your tube insertion site feels warm to the touch You have pus or a bad smell coming from your tube insertion site You have increased urine output or you feel burning when urinating  Get help right away if: You have pain in your abdomen during the first week You have chest pain or have trouble breathing You have a  new appearance of blood in your urine You have a fever or chills You have back pain that is not relieved by your medicine You have decreased urine output Your nephrostomy tube comes out  Moderate Conscious Sedation, Adult, Care After This sheet gives you information about how to care for yourself after your procedure. Your health care provider may also give you more specific instructions. If you have problems or questions, contact your health care provider. What can I expect after the procedure? After the procedure, it is common to have: Sleepiness for several hours. Impaired judgment for several hours. Difficulty with balance. Vomiting if you eat too soon. Follow these instructions at home: For the time period you were told by your health care provider: Rest. Do not participate in activities where you could fall or become injured. Do not drive or use machinery. Do not drink alcohol. Do not take sleeping pills or medicines that cause drowsiness. Do not make important decisions or sign legal documents. Do not take care of children on your own. Eating and drinking  Follow the diet recommended by your health care provider. Drink enough fluid to keep your urine pale yellow. If you vomit: Drink water, juice, or soup when you can drink without vomiting. Make sure you have little or no nausea before eating solid foods. General instructions Take over-the-counter and prescription medicines only as told by your health care provider. Have a responsible adult stay with you for the time you are told. It is important to have someone help care for you until you are awake and alert. Do not smoke. Keep all follow-up visits as told by your health care provider. This is important. Contact a health care provider if: You are still sleepy or having trouble with balance after 24 hours. You feel light-headed. You keep feeling nauseous or you keep vomiting. You develop a rash. You have a fever. You  have redness or swelling around the IV site. Get help right away if: You have trouble breathing. You have new-onset confusion at home. Summary After the procedure, it is common to feel sleepy, have impaired judgment, or feel nauseous if you eat too soon. Rest after you get home. Know the things you should not do after the procedure. Follow the diet recommended by your health care provider and drink enough fluid to keep your urine pale yellow. Get help right away if you have trouble breathing or new-onset confusion at home. This information is not intended to replace advice given to you by your health care provider. Make sure you discuss any questions you have with your health care provider. Document Revised: 04/04/2020 Document Reviewed: 11/01/2019 Elsevier Patient Education  2023 [employer].

## 2023-09-06 ENCOUNTER — Ambulatory Visit (HOSPITAL_COMMUNITY): Payer: Self-pay | Admitting: Anesthesiology

## 2023-09-06 ENCOUNTER — Inpatient Hospital Stay (HOSPITAL_COMMUNITY)
Admission: AD | Admit: 2023-09-06 | Discharge: 2023-09-08 | DRG: 661 | Disposition: A | Discharge status: Home / Self Care | Payer: Medicare HMO | Attending: Urology | Admitting: Urology

## 2023-09-06 ENCOUNTER — Ambulatory Visit (HOSPITAL_COMMUNITY): Payer: Medicare HMO | Admitting: Physician Assistant

## 2023-09-06 ENCOUNTER — Encounter (HOSPITAL_COMMUNITY): Payer: Self-pay | Admitting: Urology

## 2023-09-06 ENCOUNTER — Encounter (HOSPITAL_COMMUNITY)
Admission: AD | Disposition: A | Discharge status: Home / Self Care | Payer: Self-pay | Source: Home / Self Care | Attending: Urology

## 2023-09-06 ENCOUNTER — Ambulatory Visit (HOSPITAL_COMMUNITY): Payer: Medicare HMO

## 2023-09-06 DIAGNOSIS — Z87891 Personal history of nicotine dependence: Secondary | ICD-10-CM

## 2023-09-06 DIAGNOSIS — N809 Endometriosis, unspecified: Secondary | ICD-10-CM | POA: Diagnosis present

## 2023-09-06 DIAGNOSIS — Z7982 Long term (current) use of aspirin: Secondary | ICD-10-CM

## 2023-09-06 DIAGNOSIS — N201 Calculus of ureter: Secondary | ICD-10-CM

## 2023-09-06 DIAGNOSIS — Z882 Allergy status to sulfonamides status: Secondary | ICD-10-CM

## 2023-09-06 DIAGNOSIS — H9192 Unspecified hearing loss, left ear: Secondary | ICD-10-CM | POA: Diagnosis present

## 2023-09-06 DIAGNOSIS — E785 Hyperlipidemia, unspecified: Secondary | ICD-10-CM

## 2023-09-06 DIAGNOSIS — Z8673 Personal history of transient ischemic attack (TIA), and cerebral infarction without residual deficits: Secondary | ICD-10-CM

## 2023-09-06 DIAGNOSIS — G479 Sleep disorder, unspecified: Secondary | ICD-10-CM | POA: Diagnosis present

## 2023-09-06 DIAGNOSIS — Z888 Allergy status to other drugs, medicaments and biological substances status: Secondary | ICD-10-CM

## 2023-09-06 DIAGNOSIS — I700 Atherosclerosis of aorta: Secondary | ICD-10-CM | POA: Diagnosis present

## 2023-09-06 DIAGNOSIS — Z8349 Family history of other endocrine, nutritional and metabolic diseases: Secondary | ICD-10-CM

## 2023-09-06 DIAGNOSIS — Z880 Allergy status to penicillin: Secondary | ICD-10-CM

## 2023-09-06 DIAGNOSIS — N200 Calculus of kidney: Secondary | ICD-10-CM | POA: Diagnosis not present

## 2023-09-06 DIAGNOSIS — Z8249 Family history of ischemic heart disease and other diseases of the circulatory system: Secondary | ICD-10-CM

## 2023-09-06 DIAGNOSIS — Z79899 Other long term (current) drug therapy: Secondary | ICD-10-CM

## 2023-09-06 DIAGNOSIS — E7800 Pure hypercholesterolemia, unspecified: Secondary | ICD-10-CM | POA: Diagnosis present

## 2023-09-06 DIAGNOSIS — E278 Other specified disorders of adrenal gland: Secondary | ICD-10-CM | POA: Diagnosis present

## 2023-09-06 DIAGNOSIS — I10 Essential (primary) hypertension: Principal | ICD-10-CM | POA: Diagnosis present

## 2023-09-06 DIAGNOSIS — F32A Depression, unspecified: Secondary | ICD-10-CM | POA: Diagnosis present

## 2023-09-06 DIAGNOSIS — K219 Gastro-esophageal reflux disease without esophagitis: Secondary | ICD-10-CM | POA: Diagnosis present

## 2023-09-06 DIAGNOSIS — Z833 Family history of diabetes mellitus: Secondary | ICD-10-CM

## 2023-09-06 DIAGNOSIS — Z90710 Acquired absence of both cervix and uterus: Secondary | ICD-10-CM

## 2023-09-06 HISTORY — PX: NEPHROLITHOTOMY: SHX5134

## 2023-09-06 LAB — HIV ANTIBODY (ROUTINE TESTING W REFLEX): HIV Screen 4th Generation wRfx: NONREACTIVE

## 2023-09-06 LAB — ABO/RH: ABO/RH(D): O POS

## 2023-09-06 LAB — HEMOGLOBIN AND HEMATOCRIT, BLOOD
HCT: 35.7 % — ABNORMAL LOW (ref 36.0–46.0)
Hemoglobin: 11.2 g/dL — ABNORMAL LOW (ref 12.0–15.0)

## 2023-09-06 SURGERY — NEPHROLITHOTOMY PERCUTANEOUS
Anesthesia: General | Laterality: Left

## 2023-09-06 MED ORDER — HYDROCODONE-ACETAMINOPHEN 5-325 MG PO TABS: 1.0000 | ORAL_TABLET | Freq: Four times a day (QID) | ORAL | 0 refills | Status: DC | PRN

## 2023-09-06 MED ORDER — MIDAZOLAM HCL 2 MG/2ML IJ SOLN
INTRAMUSCULAR | Status: AC
  Filled 2023-09-06: qty 2

## 2023-09-06 MED ORDER — ORAL CARE MOUTH RINSE: 15.0000 mL | Freq: Once | OROMUCOSAL | Status: AC

## 2023-09-06 MED ORDER — HYOSCYAMINE SULFATE 0.125 MG SL SUBL
0.1250 mg | SUBLINGUAL_TABLET | SUBLINGUAL | Status: DC | PRN
  Administered 2023-09-07 – 2023-09-08 (×3): 0.125 mg via SUBLINGUAL
  Filled 2023-09-06 (×3): qty 1

## 2023-09-06 MED ORDER — PHENYLEPHRINE HCL (PRESSORS) 10 MG/ML IV SOLN
INTRAVENOUS | Status: DC | PRN
Start: 2023-09-06 — End: 2023-09-06
  Administered 2023-09-06 (×3): 80 ug via INTRAVENOUS

## 2023-09-06 MED ORDER — ONDANSETRON HCL 4 MG/2ML IJ SOLN: 4.0000 mg | Freq: Once | INTRAMUSCULAR | Status: DC | PRN

## 2023-09-06 MED ORDER — BISACODYL 10 MG RE SUPP: 10.0000 mg | Freq: Every day | RECTAL | Status: DC | PRN

## 2023-09-06 MED ORDER — FENTANYL CITRATE (PF) 100 MCG/2ML IJ SOLN
INTRAMUSCULAR | Status: AC
  Filled 2023-09-06: qty 2

## 2023-09-06 MED ORDER — ACETAMINOPHEN 325 MG PO TABS: 650.0000 mg | ORAL_TABLET | ORAL | Status: DC | PRN

## 2023-09-06 MED ORDER — CHLORHEXIDINE GLUCONATE 0.12 % MT SOLN
15.0000 mL | Freq: Once | OROMUCOSAL | Status: AC
  Administered 2023-09-06: 15 mL via OROMUCOSAL

## 2023-09-06 MED ORDER — ACETAMINOPHEN 10 MG/ML IV SOLN
INTRAVENOUS | Status: AC
  Filled 2023-09-06: qty 100

## 2023-09-06 MED ORDER — SUGAMMADEX SODIUM 200 MG/2ML IV SOLN
INTRAVENOUS | Status: DC | PRN
Start: 2023-09-06 — End: 2023-09-06
  Administered 2023-09-06: 180 mg via INTRAVENOUS

## 2023-09-06 MED ORDER — PROPOFOL 10 MG/ML IV BOLUS
INTRAVENOUS | Status: DC | PRN
  Administered 2023-09-06: 150 mg via INTRAVENOUS

## 2023-09-06 MED ORDER — ONDANSETRON HCL 4 MG/2ML IJ SOLN
INTRAMUSCULAR | Status: DC | PRN
  Administered 2023-09-06: 4 mg via INTRAVENOUS

## 2023-09-06 MED ORDER — HYDROMORPHONE HCL 1 MG/ML IJ SOLN
INTRAMUSCULAR | Status: AC
  Filled 2023-09-06: qty 1

## 2023-09-06 MED ORDER — SENNOSIDES-DOCUSATE SODIUM 8.6-50 MG PO TABS
1.0000 | ORAL_TABLET | Freq: Every evening | ORAL | Status: DC | PRN
  Administered 2023-09-08: 1 via ORAL
  Filled 2023-09-06: qty 1

## 2023-09-06 MED ORDER — DEXAMETHASONE SODIUM PHOSPHATE 10 MG/ML IJ SOLN
INTRAMUSCULAR | Status: DC | PRN
  Administered 2023-09-06: 5 mg via INTRAVENOUS

## 2023-09-06 MED ORDER — CIPROFLOXACIN IN D5W 400 MG/200ML IV SOLN
400.0000 mg | Freq: Two times a day (BID) | INTRAVENOUS | Status: AC
  Administered 2023-09-06 – 2023-09-08 (×4): 400 mg via INTRAVENOUS
  Filled 2023-09-06 (×4): qty 200

## 2023-09-06 MED ORDER — ACETAMINOPHEN 10 MG/ML IV SOLN: 1000.0000 mg | Freq: Once | INTRAVENOUS | Status: DC | PRN

## 2023-09-06 MED ORDER — OXYCODONE HCL 5 MG/5ML PO SOLN: 5.0000 mg | Freq: Once | ORAL | Status: DC | PRN

## 2023-09-06 MED ORDER — LIDOCAINE 2% (20 MG/ML) 5 ML SYRINGE
INTRAMUSCULAR | Status: DC | PRN
  Administered 2023-09-06: 80 mg via INTRAVENOUS

## 2023-09-06 MED ORDER — HYDROMORPHONE HCL 1 MG/ML IJ SOLN: 0.2500 mg | INTRAMUSCULAR | Status: DC | PRN

## 2023-09-06 MED ORDER — OXYCODONE HCL 5 MG PO TABS: 5.0000 mg | ORAL_TABLET | Freq: Once | ORAL | Status: DC | PRN

## 2023-09-06 MED ORDER — PROPOFOL 10 MG/ML IV BOLUS
INTRAVENOUS | Status: AC
  Filled 2023-09-06: qty 20

## 2023-09-06 MED ORDER — SODIUM CHLORIDE 0.9 % IR SOLN
Status: DC | PRN
  Administered 2023-09-06: 18000 mL via INTRAVESICAL

## 2023-09-06 MED ORDER — POTASSIUM CHLORIDE IN NACL 20-0.45 MEQ/L-% IV SOLN
INTRAVENOUS | Status: DC
  Filled 2023-09-06 (×5): qty 1000

## 2023-09-06 MED ORDER — EPHEDRINE SULFATE (PRESSORS) 50 MG/ML IJ SOLN
INTRAMUSCULAR | Status: DC | PRN
Start: 2023-09-06 — End: 2023-09-06
  Administered 2023-09-06: 10 mg via INTRAVENOUS

## 2023-09-06 MED ORDER — FENTANYL CITRATE (PF) 100 MCG/2ML IJ SOLN
INTRAMUSCULAR | Status: DC | PRN
  Administered 2023-09-06: 75 ug via INTRAVENOUS
  Administered 2023-09-06: 25 ug via INTRAVENOUS

## 2023-09-06 MED ORDER — IOHEXOL 300 MG/ML  SOLN
INTRAMUSCULAR | Status: DC | PRN
  Administered 2023-09-06: 10 mL

## 2023-09-06 MED ORDER — DROPERIDOL 2.5 MG/ML IJ SOLN: 0.6250 mg | Freq: Once | INTRAMUSCULAR | Status: DC | PRN

## 2023-09-06 MED ORDER — METOPROLOL TARTRATE 50 MG PO TABS
50.0000 mg | ORAL_TABLET | Freq: Two times a day (BID) | ORAL | Status: DC
  Administered 2023-09-06 – 2023-09-08 (×4): 50 mg via ORAL
  Filled 2023-09-06 (×4): qty 1

## 2023-09-06 MED ORDER — HYDROCODONE-ACETAMINOPHEN 5-325 MG PO TABS
1.0000 | ORAL_TABLET | ORAL | Status: DC | PRN
  Administered 2023-09-06 – 2023-09-07 (×3): 2 via ORAL
  Filled 2023-09-06 (×4): qty 2

## 2023-09-06 MED ORDER — HYDROMORPHONE HCL 1 MG/ML IJ SOLN
0.5000 mg | INTRAMUSCULAR | Status: DC | PRN
  Administered 2023-09-06 – 2023-09-07 (×6): 1 mg via INTRAVENOUS
  Filled 2023-09-06 (×5): qty 1

## 2023-09-06 MED ORDER — PANTOPRAZOLE SODIUM 40 MG PO TBEC
40.0000 mg | DELAYED_RELEASE_TABLET | Freq: Every day | ORAL | Status: DC
  Administered 2023-09-07 – 2023-09-08 (×2): 40 mg via ORAL
  Filled 2023-09-06 (×2): qty 1

## 2023-09-06 MED ORDER — ACETAMINOPHEN 10 MG/ML IV SOLN
INTRAVENOUS | Status: DC | PRN
Start: 2023-09-06 — End: 2023-09-06
  Administered 2023-09-06: 1000 mg via INTRAVENOUS

## 2023-09-06 MED ORDER — PHENYLEPHRINE 80 MCG/ML (10ML) SYRINGE FOR IV PUSH (FOR BLOOD PRESSURE SUPPORT)
PREFILLED_SYRINGE | INTRAVENOUS | Status: DC | PRN
  Administered 2023-09-06: 160 ug via INTRAVENOUS

## 2023-09-06 MED ORDER — POLYVINYL ALCOHOL 1.4 % OP SOLN: 1.0000 [drp] | OPHTHALMIC | Status: DC | PRN

## 2023-09-06 MED ORDER — LISINOPRIL 10 MG PO TABS
10.0000 mg | ORAL_TABLET | Freq: Every day | ORAL | Status: DC
  Administered 2023-09-06 – 2023-09-08 (×3): 10 mg via ORAL
  Filled 2023-09-06 (×3): qty 1

## 2023-09-06 MED ORDER — EPHEDRINE SULFATE-NACL 50-0.9 MG/10ML-% IV SOSY
PREFILLED_SYRINGE | INTRAVENOUS | Status: DC | PRN
  Administered 2023-09-06: 5 mg via INTRAVENOUS
  Administered 2023-09-06: 10 mg via INTRAVENOUS
  Administered 2023-09-06: 5 mg via INTRAVENOUS
  Administered 2023-09-06 (×2): 10 mg via INTRAVENOUS

## 2023-09-06 MED ORDER — FLEET ENEMA RE ENEM: 1.0000 | ENEMA | Freq: Once | RECTAL | Status: DC | PRN

## 2023-09-06 MED ORDER — ATORVASTATIN CALCIUM 20 MG PO TABS
20.0000 mg | ORAL_TABLET | Freq: Every day | ORAL | Status: DC
  Administered 2023-09-06 – 2023-09-08 (×3): 20 mg via ORAL
  Filled 2023-09-06 (×3): qty 1

## 2023-09-06 MED ORDER — STERILE WATER FOR IRRIGATION IR SOLN
Status: DC | PRN
Start: 2023-09-06 — End: 2023-09-06
  Administered 2023-09-06: 500 mL

## 2023-09-06 MED ORDER — ROCURONIUM BROMIDE 10 MG/ML (PF) SYRINGE
PREFILLED_SYRINGE | INTRAVENOUS | Status: AC
  Filled 2023-09-06: qty 10

## 2023-09-06 MED ORDER — CIPROFLOXACIN IN D5W 400 MG/200ML IV SOLN
400.0000 mg | Freq: Two times a day (BID) | INTRAVENOUS | Status: DC
  Administered 2023-09-06: 400 mg via INTRAVENOUS
  Filled 2023-09-06: qty 200

## 2023-09-06 MED ORDER — SERTRALINE HCL 50 MG PO TABS
150.0000 mg | ORAL_TABLET | Freq: Every day | ORAL | Status: DC
  Administered 2023-09-06 – 2023-09-07 (×2): 150 mg via ORAL
  Filled 2023-09-06 (×2): qty 1

## 2023-09-06 MED ORDER — MIDAZOLAM HCL 5 MG/5ML IJ SOLN
INTRAMUSCULAR | Status: DC | PRN
  Administered 2023-09-06: 2 mg via INTRAVENOUS

## 2023-09-06 MED ORDER — ONDANSETRON HCL 4 MG/2ML IJ SOLN: 4.0000 mg | INTRAMUSCULAR | Status: DC | PRN

## 2023-09-06 MED ORDER — LACTATED RINGERS IV SOLN: INTRAVENOUS | Status: DC

## 2023-09-06 MED ORDER — SERTRALINE HCL 50 MG PO TABS: 150.0000 mg | ORAL_TABLET | Freq: Every day | ORAL | Status: DC

## 2023-09-06 MED ORDER — ROCURONIUM BROMIDE 10 MG/ML (PF) SYRINGE
PREFILLED_SYRINGE | INTRAVENOUS | Status: DC | PRN
  Administered 2023-09-06: 50 mg via INTRAVENOUS

## 2023-09-06 SURGICAL SUPPLY — 55 items
APL PRP STRL LF DISP 70% ISPRP (MISCELLANEOUS) ×1
APL SKNCLS STERI-STRIP NONHPOA (GAUZE/BANDAGES/DRESSINGS) ×1
BAG COUNTER SPONGE SURGICOUNT (BAG) IMPLANT
BAG DRN RND TRDRP ANRFLXCHMBR (UROLOGICAL SUPPLIES) ×1
BAG SPNG CNTER NS LX DISP (BAG)
BAG URINE DRAIN 2000ML AR STRL (UROLOGICAL SUPPLIES) ×1 IMPLANT
BASKET STONE NITINOL 3FRX115MB (UROLOGICAL SUPPLIES) IMPLANT
BASKET ZERO TIP NITINOL 2.4FR (BASKET) IMPLANT
BENZOIN TINCTURE PRP APPL 2/3 (GAUZE/BANDAGES/DRESSINGS) ×1 IMPLANT
BLADE SURG 15 STRL LF DISP TIS (BLADE) ×1 IMPLANT
BLADE SURG 15 STRL SS (BLADE) ×1
BSKT STON RTRVL ZERO TP 2.4FR (BASKET) ×1
CATH 2WAY 30CC 24FR (CATHETERS) IMPLANT
CATH COUNCIL 22FR (CATHETERS) IMPLANT
CATH ROBINSON RED A/P 20FR (CATHETERS) IMPLANT
CATH URETERAL DUAL LUMEN 10F (MISCELLANEOUS) ×1 IMPLANT
CATH URETL OPEN 5X70 (CATHETERS) IMPLANT
CATH UROLOGY TORQUE 40 (MISCELLANEOUS) ×1 IMPLANT
CATH X-FORCE N30 NEPHROSTOMY (TUBING) ×1 IMPLANT
CHLORAPREP W/TINT 26 (MISCELLANEOUS) ×1 IMPLANT
COVER BACK TABLE 60X90IN (DRAPES) ×1 IMPLANT
DRAPE C-ARM 42X120 X-RAY (DRAPES) ×1 IMPLANT
DRAPE LINGEMAN PERC (DRAPES) ×1 IMPLANT
DRAPE SURG IRRIG POUCH 19X23 (DRAPES) ×1 IMPLANT
DRSG TEGADERM 8X12 (GAUZE/BANDAGES/DRESSINGS) ×2 IMPLANT
EXTRACTOR STONE NITINOL NGAGE (UROLOGICAL SUPPLIES) IMPLANT
GAUZE PAD ABD 8X10 STRL (GAUZE/BANDAGES/DRESSINGS) ×2 IMPLANT
GAUZE SPONGE 4X4 12PLY STRL (GAUZE/BANDAGES/DRESSINGS) IMPLANT
GLOVE SURG SS PI 8.0 STRL IVOR (GLOVE) ×1 IMPLANT
GOWN STRL REUS W/ TWL XL LVL3 (GOWN DISPOSABLE) ×1 IMPLANT
GOWN STRL REUS W/TWL XL LVL3 (GOWN DISPOSABLE) ×1
GUIDEWIRE AMPLAZ .035X145 (WIRE) ×2 IMPLANT
GUIDEWIRE STR DUAL SENSOR (WIRE) IMPLANT
KIT BASIN OR (CUSTOM PROCEDURE TRAY) ×1 IMPLANT
KIT PROBE 340X3.4XDISP GRN (MISCELLANEOUS) IMPLANT
KIT PROBE TRILOGY 3.4X340 (MISCELLANEOUS)
KIT PROBE TRILOGY 3.9X350 (MISCELLANEOUS) IMPLANT
KIT TURNOVER KIT A (KITS) IMPLANT
LASER FIB FLEXIVA PULSE ID 365 (Laser) IMPLANT
LUBRICANT JELLY K Y 4OZ (MISCELLANEOUS) ×1 IMPLANT
MANIFOLD NEPTUNE II (INSTRUMENTS) ×1 IMPLANT
NS IRRIG 1000ML POUR BTL (IV SOLUTION) ×1 IMPLANT
PACK CYSTO (CUSTOM PROCEDURE TRAY) IMPLANT
SHEATH PEELAWAY SET 9 (SHEATH) IMPLANT
SPONGE T-LAP 4X18 ~~LOC~~+RFID (SPONGE) ×1 IMPLANT
SUT SILK 2 0 30 PSL (SUTURE) ×1 IMPLANT
SYR 10ML LL (SYRINGE) ×1 IMPLANT
SYR 20ML LL LF (SYRINGE) ×2 IMPLANT
TOWEL OR 17X26 10 PK STRL BLUE (TOWEL DISPOSABLE) ×1 IMPLANT
TRACTIP FLEXIVA PULS ID 200XHI (Laser) IMPLANT
TRACTIP FLEXIVA PULSE ID 200 (Laser)
TRAY FOLEY MTR SLVR 16FR STAT (SET/KITS/TRAYS/PACK) ×1 IMPLANT
TUBING CONNECTING 10 (TUBING) ×1 IMPLANT
TUBING STONE CATCHER TRILOGY (MISCELLANEOUS) IMPLANT
TUBING UROLOGY SET (TUBING) ×1 IMPLANT

## 2023-09-06 NOTE — Anesthesia Procedure Notes (Signed)
Procedure Name: Intubation Date/Time: 09/06/2023 9:39 AM  Performed by: Deri Fuelling, CRNAPre-anesthesia Checklist: Patient identified, Emergency Drugs available, Suction available and Patient being monitored Patient Re-evaluated:Patient Re-evaluated prior to induction Oxygen Delivery Method: Circle system utilized Preoxygenation: Pre-oxygenation with 100% oxygen Induction Type: IV induction Ventilation: Mask ventilation without difficulty Laryngoscope Size: Mac and 3 Grade View: Grade I Tube type: Oral Tube size: 7.0 mm Number of attempts: 1 Airway Equipment and Method: Stylet and Oral airway Placement Confirmation: ETT inserted through vocal cords under direct vision, positive ETCO2 and breath sounds checked- equal and bilateral Secured at: 21 cm Tube secured with: Tape Dental Injury: Teeth and Oropharynx as per pre-operative assessment

## 2023-09-06 NOTE — Op Note (Signed)
Procedure: 1.  Left percutaneous nephrolithotomy for >2 cm stone. 2.  Left antegrade nephrostogram through existing tract. 3.  Application of fluoroscopy.  Preop diagnosis: Left upper pole partial staghorn calculus.  Postop diagnosis: Same.  Surgeon: Dr. Bjorn Pippin.  Anesthesia: General.  Specimen: Stone fragments.  Drains: 1.  Foley catheter. 2.  22 French [place] Foley nephrostomy catheter with 5 French coaxial ureteral catheter.  EBL: 100 mL.  Complications: None.  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  The stone was approximately 2.5 cm in greatest dimension with smaller associated stones.  It was felt that percutaneous nephrolithotomy was indicated.  She had undergone placement of a nephrostomy tube on 09/05/2023.  Procedure: She was taken the operating room where she was given Cipro.  A general anesthetic was induced on the holding room stretcher.  A Foley catheter was placed using sterile technique.  She was then rolled prone on chest rolls on the operative table and fitted with PAS hose.  Nephrostomy access site was then prepped with ChloraPrep and after proper drying time she was draped in usual sterile fashion.  An Amplatz Super Stiff wire was then passed through the nephrostomy access catheter which was a loop catheter in the renal pelvis.  The only catheter was removed over the wire and short nephro view catheter was then used to manipulate the wire into the bladder.  An 8 French dual-lumen catheter was then advanced over the wire into the distal ureter and a second wire was passed into the bladder.  The dual-lumen catheter was removed and the nephrostomy access incision was extended to 2 cm with a knife.  The safety wire was then secured and the NephroMax balloon catheter was passed over the working wire into the renal pelvis under fluoroscopic guidance.   Balloon was inflated to 20 atm of pressure with disappearance of the waist and the access sheath was then advanced into the renal pelvis.  After the balloon had been deflated and removed the rigid nephroscope was passed and the collecting system was inspected.  I backed the sheath out and was able to locate the stones.  A 2 jaw grasper was then used to remove 2 of the smaller stones.  I then used the percutaneous trilogy lithotrite to fragment and aspirate additional stone material.  The graspers were then used to remove multiple flat fragments and smaller stones.  Once all visible fragments had been removed, fluoroscopy demonstrated 2 additional stones in an upper calyx and a small stone along the wire in the proximal ureter.  I then switched to the flexible nephroscope and was able to manipulated into the calyx with the remaining stones which were removed using a 0 tip nitinol basket.  I then removed additional stones from the renal pelvis and proximal ureter with the basket.  Finally the rigid nephroscope was replaced and the trilogy was used to aspirate a few remaining small fragments.  At this point fluoroscopy revealed no residual fragments.  The nephroscope was removed and a 22 [place] catheter was advanced over the wire into the renal pelvis under fluoroscopic guidance.  A 5 French open-ended catheter was advanced over the working wire through [employer] catheter in a coaxial fashion into the distal ureter under fluoroscopic guidance.  The sheath was backed out partially and Omnipaque was instilled to perform an antegrade nephrostogram.  The antegrade nephrostogram demonstrated good  position of the nephrostomy tube with no evidence of extravasation and good antegrade flow.  The sheath was then backed out and removed using scissors and the nephrostomy catheter secured using a 2-0 silk suture to secure the skin and the catheter.  There was some moderate bleeding from the inferior aspect of  the wound so pressure was held for approximately 10 minutes with resolution of the bleeding.  The safety wire had been removed earlier after initial placement of the nephrostomy catheter and the working wire was then removed.  The nephrostomy catheter was placed to straight drainage.  The drapes were removed and a dressing was applied with 4 x 4's, and ABD and OpSite.  She was then rolled supine on the recovery room stretcher, her anesthetic was reversed and she was moved to recovery in stable condition.  There were no complications.

## 2023-09-06 NOTE — Transfer of Care (Signed)
Immediate Anesthesia Transfer of Care Note  Patient: Teresa Lopez  Procedure(s) Performed: LEFT NEPHROLITHOTOMY PERCUTANEOUS (Left)  Patient Location: PACU  Anesthesia Type:General  Level of Consciousness: awake and alert   Airway & Oxygen Therapy: Patient Spontanous Breathing and Patient connected to face mask oxygen  Post-op Assessment: Post -op Vital signs reviewed and stable  Post vital signs: Reviewed and stable  Last Vitals:  Vitals Value Taken Time  BP 148/86 09/06/23 1112  Temp    Pulse 70 09/06/23 1113  Resp 13 09/06/23 1113  SpO2 100 % 09/06/23 1113  Vitals shown include unfiled device data.  Last Pain:  Vitals:   09/06/23 0702  TempSrc: Oral  PainSc: 5       Patients Stated Pain Goal: 0 (09/06/23 4098)  Complications: No notable events documented.

## 2023-09-06 NOTE — Anesthesia Postprocedure Evaluation (Signed)
Anesthesia Post Note  Patient: Teresa Lopez  Procedure(s) Performed: LEFT NEPHROLITHOTOMY PERCUTANEOUS (Left)     Patient location during evaluation: PACU Anesthesia Type: General Level of consciousness: awake and alert Pain management: pain level controlled Vital Signs Assessment: post-procedure vital signs reviewed and stable Respiratory status: spontaneous breathing, nonlabored ventilation, respiratory function stable and patient connected to nasal cannula oxygen Cardiovascular status: blood pressure returned to baseline and stable Postop Assessment: no apparent nausea or vomiting Anesthetic complications: no   No notable events documented.  Last Vitals:  Vitals:   09/06/23 1145 09/06/23 1300  BP: 137/69 135/85  Pulse: 62 67  Resp: 12 15  Temp: 36.6 C   SpO2: 99% 95%    Last Pain:  Vitals:   09/06/23 1315  TempSrc:   PainSc: Asleep                 Trevor Iha

## 2023-09-06 NOTE — Interval H&P Note (Signed)
History and Physical Interval Note: Her tube was successfully placed yesterday.  She has had pain from the tube placement.   09/06/2023 8:04 AM  Teresa Lopez  has presented today for surgery, with the diagnosis of LEFT RENAL STONE.  The various methods of treatment have been discussed with the patient and family. After consideration of risks, benefits and other options for treatment, the patient has consented to  Procedure(s) with comments: LEFT NEPHROLITHOTOMY PERCUTANEOUS (Left) - 120 MINS FOR CASE as a surgical intervention.  The patient's history has been reviewed, patient examined, no change in status, stable for surgery.  I have reviewed the patient's chart and labs.  Questions were answered to the patient's satisfaction.     Bjorn Pippin

## 2023-09-06 NOTE — Progress Notes (Signed)
Patient ID: Teresa Lopez, female   DOB: November 12, 1956, 67 y.o.   MRN: [id number]  She is doing well.  The urine is clear.  Hgb is 11.2.   BP 134/77 (BP Location: Left Arm)   Pulse 67   Temp 97.6 F (36.4 C) (Oral)   Resp 15   Ht 5\' 5"  (1.651 m)   Wt 77.1 kg   SpO2 92%   BMI 28.29 kg/m   I will get the foley out tonight.

## 2023-09-07 ENCOUNTER — Encounter (HOSPITAL_COMMUNITY): Payer: Self-pay | Admitting: Urology

## 2023-09-07 DIAGNOSIS — N200 Calculus of kidney: Secondary | ICD-10-CM | POA: Diagnosis present

## 2023-09-07 DIAGNOSIS — Z90710 Acquired absence of both cervix and uterus: Secondary | ICD-10-CM | POA: Diagnosis not present

## 2023-09-07 DIAGNOSIS — I10 Essential (primary) hypertension: Secondary | ICD-10-CM | POA: Diagnosis present

## 2023-09-07 DIAGNOSIS — N809 Endometriosis, unspecified: Secondary | ICD-10-CM | POA: Diagnosis present

## 2023-09-07 DIAGNOSIS — Z8673 Personal history of transient ischemic attack (TIA), and cerebral infarction without residual deficits: Secondary | ICD-10-CM | POA: Diagnosis not present

## 2023-09-07 DIAGNOSIS — I700 Atherosclerosis of aorta: Secondary | ICD-10-CM | POA: Diagnosis present

## 2023-09-07 DIAGNOSIS — E278 Other specified disorders of adrenal gland: Secondary | ICD-10-CM | POA: Diagnosis present

## 2023-09-07 DIAGNOSIS — Z87891 Personal history of nicotine dependence: Secondary | ICD-10-CM | POA: Diagnosis not present

## 2023-09-07 DIAGNOSIS — Z8349 Family history of other endocrine, nutritional and metabolic diseases: Secondary | ICD-10-CM | POA: Diagnosis not present

## 2023-09-07 DIAGNOSIS — Z888 Allergy status to other drugs, medicaments and biological substances status: Secondary | ICD-10-CM | POA: Diagnosis not present

## 2023-09-07 DIAGNOSIS — G479 Sleep disorder, unspecified: Secondary | ICD-10-CM | POA: Diagnosis present

## 2023-09-07 DIAGNOSIS — Z880 Allergy status to penicillin: Secondary | ICD-10-CM | POA: Diagnosis not present

## 2023-09-07 DIAGNOSIS — F32A Depression, unspecified: Secondary | ICD-10-CM | POA: Diagnosis present

## 2023-09-07 DIAGNOSIS — Z7982 Long term (current) use of aspirin: Secondary | ICD-10-CM | POA: Diagnosis not present

## 2023-09-07 DIAGNOSIS — Z8249 Family history of ischemic heart disease and other diseases of the circulatory system: Secondary | ICD-10-CM | POA: Diagnosis not present

## 2023-09-07 DIAGNOSIS — Z833 Family history of diabetes mellitus: Secondary | ICD-10-CM | POA: Diagnosis not present

## 2023-09-07 DIAGNOSIS — Z882 Allergy status to sulfonamides status: Secondary | ICD-10-CM | POA: Diagnosis not present

## 2023-09-07 DIAGNOSIS — H9192 Unspecified hearing loss, left ear: Secondary | ICD-10-CM | POA: Diagnosis present

## 2023-09-07 DIAGNOSIS — E785 Hyperlipidemia, unspecified: Secondary | ICD-10-CM | POA: Diagnosis present

## 2023-09-07 DIAGNOSIS — E7800 Pure hypercholesterolemia, unspecified: Secondary | ICD-10-CM | POA: Diagnosis present

## 2023-09-07 DIAGNOSIS — Z79899 Other long term (current) drug therapy: Secondary | ICD-10-CM | POA: Diagnosis not present

## 2023-09-07 DIAGNOSIS — K219 Gastro-esophageal reflux disease without esophagitis: Secondary | ICD-10-CM | POA: Diagnosis present

## 2023-09-07 MED ORDER — OXYCODONE HCL 5 MG PO TABS
5.0000 mg | ORAL_TABLET | Freq: Four times a day (QID) | ORAL | Status: DC | PRN
  Administered 2023-09-07 – 2023-09-08 (×4): 5 mg via ORAL
  Filled 2023-09-07 (×4): qty 1

## 2023-09-07 MED ORDER — DIPHENHYDRAMINE HCL 25 MG PO CAPS
25.0000 mg | ORAL_CAPSULE | Freq: Four times a day (QID) | ORAL | Status: DC | PRN
  Administered 2023-09-07: 25 mg via ORAL
  Filled 2023-09-07 (×2): qty 1

## 2023-09-07 MED ORDER — DIPHENHYDRAMINE HCL 25 MG PO CAPS
50.0000 mg | ORAL_CAPSULE | Freq: Four times a day (QID) | ORAL | Status: DC | PRN
  Administered 2023-09-07 (×2): 50 mg via ORAL
  Filled 2023-09-07 (×2): qty 2

## 2023-09-07 MED ORDER — ACETAMINOPHEN 10 MG/ML IV SOLN
1000.0000 mg | Freq: Four times a day (QID) | INTRAVENOUS | Status: AC
  Administered 2023-09-07 – 2023-09-08 (×4): 1000 mg via INTRAVENOUS
  Filled 2023-09-07 (×4): qty 100

## 2023-09-07 NOTE — Care Management Obs Status (Signed)
MEDICARE OBSERVATION STATUS NOTIFICATION   Patient Details  Name: Teresa Lopez MRN: [id number] Date of Birth: Feb 07, 1956   Medicare Observation Status Notification Given:  Yes    Lanier Clam, RN 09/07/2023, 11:45 AM

## 2023-09-07 NOTE — Plan of Care (Signed)
Problem: Clinical Measurements: Goal: Cardiovascular complication will be avoided Outcome: Progressing   Problem: Activity: Goal: Risk for activity intolerance will decrease Outcome: Progressing

## 2023-09-07 NOTE — Plan of Care (Signed)

## 2023-09-07 NOTE — TOC Initial Note (Signed)
Transition of Care [facility]) - Initial/Assessment Note    Patient Details  Name: Teresa Lopez MRN: [id number] Date of Birth: 11-Mar-1956  [facility] CM/SW Contact:    Lanier Clam, RN Phone Number: 09/07/2023, 11:48 AM  Clinical Narrative:  d/c plan home.                 Expected Discharge Plan: Home/Self Care Barriers to Discharge: Continued Medical Work up   Patient Goals and CMS Choice Patient states their goals for this hospitalization and ongoing recovery are:: Home CMS Medicare.gov Compare Post Acute Care list provided to:: Patient Choice offered to / list presented to : Patient [place] ownership interest in [facility].provided to:: Patient    Expected Discharge Plan and Services   Discharge Planning Services: CM Consult Post Acute Care Choice: Resumption of Svcs/PTA Provider Living arrangements for the past 2 months: Single Family Home                                      Prior Living Arrangements/Services Living arrangements for the past 2 months: Single Family Home Lives with:: Spouse Patient language and need for interpreter reviewed:: Yes Do you feel safe going back to the place where you live?: Yes      Need for Family Participation in Patient Care: Yes (Comment) Care giver support system in place?: Yes (comment)   Criminal Activity/Legal Involvement Pertinent to Current Situation/Hospitalization: No - Comment as needed  Activities of Daily Living Home Assistive Devices/Equipment: None ADL Screening (condition at time of admission) Patient's cognitive ability adequate to safely complete daily activities?: Yes Is the patient deaf or have difficulty hearing?: No Does the patient have difficulty seeing, even when wearing glasses/contacts?: No Does the patient have difficulty concentrating, remembering, or making decisions?: No Patient able to express need for assistance with ADLs?: Yes Does the patient have difficulty  dressing or bathing?: No Independently performs ADLs?: Yes (appropriate for developmental age) Does the patient have difficulty walking or climbing stairs?: No Weakness of Legs: None Weakness of Arms/Hands: None  Permission Sought/Granted Permission sought to share information with : Case Manager Permission granted to share information with : Yes, Verbal Permission Granted  Share Information with NAME: Case manager           Emotional Assessment Appearance:: Appears stated age Attitude/Demeanor/Rapport: Gracious Affect (typically observed): Accepting Orientation: : Oriented to Self, Oriented to Place, Oriented to  Time, Oriented to Situation Alcohol / Substance Use: Not Applicable Psych Involvement: No (comment)  Admission diagnosis:  Left renal stone [N20.0] Patient Active Problem List   Diagnosis Date Noted   Left renal stone 09/06/2023   Neck pain 02/14/2021   PCP:  Gaspar Garbe, MD Pharmacy:   [facility] [id number] - [place], [place] - [address] [place] [address] Phone: [phone] Fax: [phone]     Social Determinants of Health (SDOH) Social History: SDOH Screenings   Food Insecurity: No Food Insecurity (09/06/2023)  Housing: Low Risk  (09/06/2023)  Transportation Needs: No Transportation Needs (09/06/2023)  Utilities: Not At Risk (09/06/2023)  Tobacco Use: Medium Risk (09/06/2023)   SDOH Interventions:     Readmission Risk Interventions     No data to display

## 2023-09-08 ENCOUNTER — Other Ambulatory Visit: Payer: Self-pay

## 2023-09-08 NOTE — Progress Notes (Signed)
AVS and discharge instructions reviewed with patient and husband at the bedside. All parties verbalized understanding. Patient sent home with extra dressing supplies.

## 2023-09-08 NOTE — Plan of Care (Signed)

## 2023-09-08 NOTE — Plan of Care (Signed)
Problem: Activity: Goal: Risk for activity intolerance will decrease Outcome: Progressing   Problem: Pain Managment: Goal: General experience of comfort will improve Outcome: Progressing

## 2023-09-08 NOTE — Discharge Summary (Signed)
Date of admission: 09/06/2023  Date of discharge: 09/08/2023  Admission diagnosis: Left upper pole staghorn calculus  Discharge diagnosis: same  Secondary diagnoses: DVT, GERD, arthritis, depression, PE, HLD, HTN, TIA, brain aneurysm s/p clipping  History and Physical: For full details, please see admission history and physical. Briefly, Teresa Lopez is a 67 y.o. year old patient with a history of stones and a prior ureteroscopy with stent in [place] several years ago. She had a parathyroidectomy in 2015. Her recent calciums have been normal. She presents now with left flank pain that began about 3 weeks ago. The pain is intermittently severe and she has not been able to work. She has had no nausea or hematuria. Her UA is clear. She had a film at [facility] because of her DDD and back pain and a stone was seen. She subsequently had a CT that showed a left upper pole partial staghorn stone with a 19mm component obstructing the upper pole infundibulum along with severe other stones. 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 Her BP has been up recently and is 171/106 today.   Hospital Course: Pt was admitted and taken to the OR on 09/06/23 for a left perc nephrolithotomy and left antegrade nephrostogram. Pt tolerated the procedure well and was hemodynamically stable throughout.  She was extubated without complication and woke up from anesthesia neurologically intact.  She was transferred from the OR to PACU and then to the floor without issue.  Post op course progressed as expected.  By POD 2 she was ambulating, tolerating a regular diet, and voiding clear urine with good pain control.  Urine in the neph tube was pink on POD 2 and was d/c'd.  She was passing flatus.  Labs were stable. Pt was felt stable for d/c home.  Laboratory values:  Recent Labs    09/06/23 1136 09/07/23 0522  HGB 11.2* 11.0*  HCT 35.7* 35.1*   No results for input(s): "CREATININE" in the  last 72 hours.  Disposition: Home  Discharge instruction: The patient was instructed to be ambulatory but told to refrain from heavy lifting, strenuous activity, or driving.   Discharge medications:  Allergies as of 09/08/2023       Reactions   Codeine Nausea Only   Sulfa Antibiotics    unknown   Penicillins Rash   Unable to walk        Medication List     TAKE these medications    aspirin EC 81 MG tablet Take 162 mg by mouth at bedtime. Swallow whole.   atorvastatin 20 MG tablet Commonly known as: LIPITOR Take 20 mg by mouth daily.   HYDROcodone-acetaminophen 5-325 MG tablet Commonly known as: NORCO/VICODIN Take 1-2 tablets by mouth every 6 (six) hours as needed for moderate pain.   lisinopril 10 MG tablet Commonly known as: ZESTRIL Take 10 mg by mouth in the morning and at bedtime.   metoprolol tartrate 50 MG tablet Commonly known as: LOPRESSOR Take 50 mg by mouth 2 (two) times daily.   multivitamin with minerals Tabs tablet Take 1 tablet by mouth daily.   omeprazole 20 MG capsule Commonly known as: PRILOSEC Take 20 mg by mouth 2 (two) times daily before a meal.   polyvinyl alcohol 1.4 % ophthalmic solution Commonly known as: LIQUIFILM TEARS Place 1 drop into both eyes as needed for dry eyes.   PreserVision AREDS 2 Caps Take 1 capsule by mouth in the morning and at bedtime.  sertraline 100 MG tablet Commonly known as: ZOLOFT Take 150 mg by mouth daily.   TURMERIC PO Take 1 capsule by mouth daily.   vitamin C 1000 MG tablet Take 1,000 mg by mouth daily.   Vitamin D3 1.25 MG (50000 UT) Tabs Take 50,000 Units by mouth 2 (two) times a week.        Followup:  As discussed with Dr. Annabell Howells.

## 2023-09-13 DIAGNOSIS — N200 Calculus of kidney: Secondary | ICD-10-CM | POA: Diagnosis not present

## 2023-09-14 LAB — CALCULI, WITH PHOTOGRAPH (CLINICAL LAB)
Calcium Oxalate Monohydrate: 20 %
Uric Acid Calculi: 80 %
Weight Calculi: 372 mg

## 2023-09-27 DIAGNOSIS — Z0100 Encounter for examination of eyes and vision without abnormal findings: Secondary | ICD-10-CM | POA: Diagnosis not present

## 2023-10-06 ENCOUNTER — Ambulatory Visit: Payer: Medicare HMO | Admitting: Podiatry

## 2023-10-12 ENCOUNTER — Ambulatory Visit: Payer: Medicare HMO | Admitting: Podiatry

## 2023-10-17 ENCOUNTER — Ambulatory Visit (INDEPENDENT_AMBULATORY_CARE_PROVIDER_SITE_OTHER): Payer: Medicare HMO

## 2023-10-17 ENCOUNTER — Ambulatory Visit: Payer: Medicare HMO | Admitting: Podiatry

## 2023-10-17 ENCOUNTER — Encounter: Payer: Self-pay | Admitting: Podiatry

## 2023-10-17 DIAGNOSIS — M778 Other enthesopathies, not elsewhere classified: Secondary | ICD-10-CM

## 2023-10-17 DIAGNOSIS — M7662 Achilles tendinitis, left leg: Secondary | ICD-10-CM

## 2023-10-17 MED ORDER — TRIAMCINOLONE ACETONIDE 10 MG/ML IJ SUSP
10.0000 mg | Freq: Once | INTRAMUSCULAR | Status: AC
Start: 2023-10-17 — End: 2023-10-17
  Administered 2023-10-17: 10 mg via INTRA_ARTICULAR

## 2023-10-19 NOTE — Progress Notes (Signed)
Subjective:   Patient ID: Teresa Lopez, female   DOB: 67 y.o.   MRN: [id number]   HPI Patient states she is developed a lot of pain in the back of the left heel and its gotten inflamed all of a sudden and feels like a cyst.  States that it gets sore recently the cyst had not been there for years.  Patient does not smoke likes to be active   Review of Systems  All other systems reviewed and are negative.       Objective:  Physical Exam Vitals and nursing note reviewed.  Constitutional:      Appearance: She is well-developed.  Pulmonary:     Effort: Pulmonary effort is normal.  Musculoskeletal:        General: Normal range of motion.  Skin:    General: Skin is warm.  Neurological:     Mental Status: She is alert.     Neurovascular status intact muscle strength adequate range of motion adequate inflammation posterior lateral left heel with a small freely movable cyst in the area with inflammation and discomfort associated with it recent     Assessment:  Inflammatory tendinitis left fluid buildup with also cyst formation lateral side left     Plan:  H&P reviewed x-ray reviewed I recommended careful injection of a small amount of steroid in the area and explained risk and take chances for rupture.  Patient wants to undergo this I did do sterile prep I carefully injected the lateral side 3 mg dexamethasone Kenalog advised on reduced activity ice and patient will be seen back as needed  X-rays indicate that there is no bone spur associated with the soft tissue inflammation

## 2023-10-20 DIAGNOSIS — N200 Calculus of kidney: Secondary | ICD-10-CM | POA: Diagnosis not present

## 2023-10-22 DIAGNOSIS — R059 Cough, unspecified: Secondary | ICD-10-CM | POA: Diagnosis not present

## 2023-11-11 DIAGNOSIS — R82991 Hypocitraturia: Secondary | ICD-10-CM | POA: Diagnosis not present

## 2023-11-25 DIAGNOSIS — N200 Calculus of kidney: Secondary | ICD-10-CM | POA: Diagnosis not present

## 2023-11-29 DIAGNOSIS — R053 Chronic cough: Secondary | ICD-10-CM | POA: Diagnosis not present

## 2023-11-29 DIAGNOSIS — F321 Major depressive disorder, single episode, moderate: Secondary | ICD-10-CM | POA: Diagnosis not present

## 2023-11-29 DIAGNOSIS — E7800 Pure hypercholesterolemia, unspecified: Secondary | ICD-10-CM | POA: Diagnosis not present

## 2023-11-29 DIAGNOSIS — F17201 Nicotine dependence, unspecified, in remission: Secondary | ICD-10-CM | POA: Diagnosis not present

## 2023-11-29 DIAGNOSIS — E669 Obesity, unspecified: Secondary | ICD-10-CM | POA: Diagnosis not present

## 2023-11-29 DIAGNOSIS — I10 Essential (primary) hypertension: Secondary | ICD-10-CM | POA: Diagnosis not present

## 2023-11-29 DIAGNOSIS — M5030 Other cervical disc degeneration, unspecified cervical region: Secondary | ICD-10-CM | POA: Diagnosis not present

## 2023-11-29 DIAGNOSIS — R339 Retention of urine, unspecified: Secondary | ICD-10-CM | POA: Diagnosis not present

## 2023-12-07 DIAGNOSIS — I10 Essential (primary) hypertension: Secondary | ICD-10-CM | POA: Diagnosis not present

## 2023-12-15 DIAGNOSIS — J029 Acute pharyngitis, unspecified: Secondary | ICD-10-CM | POA: Diagnosis not present

## 2023-12-15 DIAGNOSIS — R059 Cough, unspecified: Secondary | ICD-10-CM | POA: Diagnosis not present

## 2023-12-15 DIAGNOSIS — Z20822 Contact with and (suspected) exposure to covid-19: Secondary | ICD-10-CM | POA: Diagnosis not present

## 2023-12-15 DIAGNOSIS — J019 Acute sinusitis, unspecified: Secondary | ICD-10-CM | POA: Diagnosis not present

## 2024-02-21 DIAGNOSIS — N200 Calculus of kidney: Secondary | ICD-10-CM | POA: Diagnosis not present

## 2024-03-12 DIAGNOSIS — N200 Calculus of kidney: Secondary | ICD-10-CM | POA: Diagnosis not present

## 2024-04-12 DIAGNOSIS — Z6830 Body mass index (BMI) 30.0-30.9, adult: Secondary | ICD-10-CM | POA: Diagnosis not present

## 2024-04-12 DIAGNOSIS — M25562 Pain in left knee: Secondary | ICD-10-CM | POA: Diagnosis not present

## 2024-04-12 DIAGNOSIS — M25462 Effusion, left knee: Secondary | ICD-10-CM | POA: Diagnosis not present

## 2024-04-12 DIAGNOSIS — E669 Obesity, unspecified: Secondary | ICD-10-CM | POA: Diagnosis not present

## 2024-04-12 DIAGNOSIS — I10 Essential (primary) hypertension: Secondary | ICD-10-CM | POA: Diagnosis not present

## 2024-04-17 DIAGNOSIS — M25562 Pain in left knee: Secondary | ICD-10-CM | POA: Diagnosis not present

## 2024-04-30 ENCOUNTER — Other Ambulatory Visit (HOSPITAL_COMMUNITY): Payer: Self-pay | Admitting: Orthopedic Surgery

## 2024-04-30 DIAGNOSIS — M25562 Pain in left knee: Secondary | ICD-10-CM

## 2024-05-07 ENCOUNTER — Ambulatory Visit (HOSPITAL_COMMUNITY)
Admission: RE | Admit: 2024-05-07 | Discharge: 2024-05-07 | Disposition: A | Discharge status: Home / Self Care | Source: Ambulatory Visit | Attending: Orthopedic Surgery | Admitting: Orthopedic Surgery

## 2024-05-07 DIAGNOSIS — M948X6 Other specified disorders of cartilage, lower leg: Secondary | ICD-10-CM | POA: Diagnosis not present

## 2024-05-07 DIAGNOSIS — M7122 Synovial cyst of popliteal space [Baker], left knee: Secondary | ICD-10-CM | POA: Diagnosis not present

## 2024-05-07 DIAGNOSIS — M25562 Pain in left knee: Secondary | ICD-10-CM | POA: Insufficient documentation

## 2024-05-07 DIAGNOSIS — M1712 Unilateral primary osteoarthritis, left knee: Secondary | ICD-10-CM | POA: Diagnosis not present

## 2024-05-25 DIAGNOSIS — K219 Gastro-esophageal reflux disease without esophagitis: Secondary | ICD-10-CM | POA: Diagnosis not present

## 2024-05-25 DIAGNOSIS — D649 Anemia, unspecified: Secondary | ICD-10-CM | POA: Diagnosis not present

## 2024-05-25 DIAGNOSIS — E7800 Pure hypercholesterolemia, unspecified: Secondary | ICD-10-CM | POA: Diagnosis not present

## 2024-05-25 DIAGNOSIS — Z1212 Encounter for screening for malignant neoplasm of rectum: Secondary | ICD-10-CM | POA: Diagnosis not present

## 2024-05-25 DIAGNOSIS — I10 Essential (primary) hypertension: Secondary | ICD-10-CM | POA: Diagnosis not present

## 2024-05-25 DIAGNOSIS — Z79899 Other long term (current) drug therapy: Secondary | ICD-10-CM | POA: Diagnosis not present

## 2024-05-31 DIAGNOSIS — N200 Calculus of kidney: Secondary | ICD-10-CM | POA: Diagnosis not present

## 2024-06-01 DIAGNOSIS — R053 Chronic cough: Secondary | ICD-10-CM | POA: Diagnosis not present

## 2024-06-01 DIAGNOSIS — F321 Major depressive disorder, single episode, moderate: Secondary | ICD-10-CM | POA: Diagnosis not present

## 2024-06-01 DIAGNOSIS — F17201 Nicotine dependence, unspecified, in remission: Secondary | ICD-10-CM | POA: Diagnosis not present

## 2024-06-01 DIAGNOSIS — R339 Retention of urine, unspecified: Secondary | ICD-10-CM | POA: Diagnosis not present

## 2024-06-01 DIAGNOSIS — E669 Obesity, unspecified: Secondary | ICD-10-CM | POA: Diagnosis not present

## 2024-06-01 DIAGNOSIS — D229 Melanocytic nevi, unspecified: Secondary | ICD-10-CM | POA: Diagnosis not present

## 2024-06-01 DIAGNOSIS — I10 Essential (primary) hypertension: Secondary | ICD-10-CM | POA: Diagnosis not present

## 2024-06-01 DIAGNOSIS — R82998 Other abnormal findings in urine: Secondary | ICD-10-CM | POA: Diagnosis not present

## 2024-06-01 DIAGNOSIS — Z0000 Encounter for general adult medical examination without abnormal findings: Secondary | ICD-10-CM | POA: Diagnosis not present

## 2024-07-02 ENCOUNTER — Other Ambulatory Visit: Payer: Self-pay

## 2024-07-02 ENCOUNTER — Encounter (HOSPITAL_COMMUNITY): Payer: Self-pay | Admitting: Urology

## 2024-07-02 ENCOUNTER — Other Ambulatory Visit: Payer: Self-pay | Admitting: Urology

## 2024-07-02 DIAGNOSIS — N132 Hydronephrosis with renal and ureteral calculous obstruction: Secondary | ICD-10-CM | POA: Diagnosis not present

## 2024-07-02 DIAGNOSIS — K5730 Diverticulosis of large intestine without perforation or abscess without bleeding: Secondary | ICD-10-CM | POA: Diagnosis not present

## 2024-07-02 DIAGNOSIS — R932 Abnormal findings on diagnostic imaging of liver and biliary tract: Secondary | ICD-10-CM | POA: Diagnosis not present

## 2024-07-02 DIAGNOSIS — N202 Calculus of kidney with calculus of ureter: Secondary | ICD-10-CM | POA: Diagnosis not present

## 2024-07-02 NOTE — H&P (Signed)
 Left renal calculi   Teresa Lopez is a 68 year old female patient of Dr. Watt. She has large volume left renal calculi and is scheduled for a left percutaneous nephrostolithotomy on September 17. She presents today due to concern of low urine volume. She will have a sense to void periodically but has not produced much urine. She states that she is drinking an adequate amount of fluid. She denies significant left-sided flank pain. She denies hematuria, dysuria, nausea, vomiting, or fever.   09/13/2023: 68 year old female who underwent a left-sided PCNL presents today for follow-up. KUB shows no residual stone fragments. She has been doing well and is healing from surgery. Her pcn site is without redness or erythema and is not leaking. She denies any further pain or discomfort.   11/25/2023: 68 year old female presents today for follow-up renal ultrasound and office visit. At last office visit she had a potential fragment in the renal shadow and we discussed following up a little sooner to ensure she was doing well and not having any flank pain or discomfort. She presents today and is doing well. Denies gross hematuria or passage of stone material.   03/12/2024: Patient returns today for follow-up and review of 24-hour urine results. Results showed a significant decrease in urine volume output which have likely skewed the remaining results due to saturation. She notes she did not drink much water  the day of her test and she would like to repeat this. She was previously started on potassium citrate but discontinued due to elevated potassium levels at follow up. She has not been taking an alternative citrate supplement. She denies interval passage of stone material or infection. She has had intermittent left flank twinges and is concerned she may have more stones. Denies gross hematuria, dysuria, flank pain, fever/chills, nausea/vomiting. Renal ultrasound from December 2024 showed 2 nonobstructing right renal  calculi.   07/02/24: Teresa Lopez returns today with some right low back and RLQ pain and left flank pain. Her prior stone was 20% Ca Ox and 80% Uric acid. Her 24 hr urine had a volume 2130 with low citrate and aciduria. She is off of potassium citrate but had been on litholyte until a week ago. The UA is clear.     ALLERGIES: penicillin sulfa    MEDICATIONS: GoodSense Aspirin 81 MG Tablet Chewable  Metoprolol  Succinate ER 50 MG Tablet Extended Release 24 Hour  Omeprazole 20 MG Capsule Delayed Release  Atorvastatin  Calcium  20 MG Tablet  Icaps Areds  LithoLyte 400-60 MG Packet 1 pack PO BID  Losartan Potassium 100 MG Tablet 1 tablet PO Daily  Meloxicam 15 MG Tablet  Multivitamin  Sertraline  HCl 100 MG Tablet  Turmeric  Vitamin C  Vitamin D3     GU PSH: No GU PSH      PSH Notes: Shoulder, kidney stone, parathyroid   NON-GU PSH: Cataract surgery - 2022 Hysterectomy Tonsillectomy Visit Complexity (formerly GPC1X) - 03/12/2024, 11/25/2023     GU PMH: Renal calculus - 03/12/2024, - 11/25/2023, - 09/13/2023, - 08/18/2023, She has a 2cm right upper pole infudibular stone with additional large stone in the upper pole for a total diameter of >3cm. She will need a PCNL for management. I sent hydrocodone  for the pain. I reviewed the risks of bleeding, infection, injury to the kidney and adjacent structures, renal loss, need for secondary procedures, urine leaks, thrombotic events and anesthetic complications. , - 08/05/2023 Ureteral obstruction secondary to calculous - 11/25/2023, - 09/13/2023, - 08/05/2023      PMH Notes:  Blood clot in leg/ DVT  Blood clot in lung/ PE at age20.  Brain aneuryms with clipping 2018.    NON-GU PMH: Hypertension, Her BP has been up recently. I will have her stop the meloxicam and this will need to be addressed by Dr. Tisovec. - 08/05/2023 Left adrenal neoplasm, She will need a CT with washout in 1 year. -  08/05/2023 Arthritis Depression GERD Hypercholesterolemia Stroke/TIA    FAMILY HISTORY: Brain Cancer - Mother Breast Cancer - Sister Death In The Family Father - Other Death In The Family Mother - Other Vocal cord cancer - Father   SOCIAL HISTORY: Marital Status: Single Preferred Language: English; Ethnicity: Not Hispanic Or Latino; Race: White Current Smoking Status: Patient does not smoke anymore. Has not smoked since 07/21/2019. Smoked for 30 years.   Tobacco Use Assessment Completed: Used Tobacco in last 30 days? Does not use smokeless tobacco. Drinks 1 drink per week.  Does not use drugs. Drinks 1 caffeinated drink per day. Patient's occupation Therapist, occupational at [employer].Teresa Lopez    REVIEW OF SYSTEMS:    GU Review Female:   Patient denies frequent urination, hard to postpone urination, burning /pain with urination, get up at night to urinate, leakage of urine, stream starts and stops, trouble starting your stream, have to strain to urinate, and being pregnant.  Gastrointestinal (Upper):   Patient denies nausea, vomiting, and indigestion/ heartburn.  Gastrointestinal (Lower):   Patient denies diarrhea and constipation.  Constitutional:   Patient denies fever, night sweats, weight loss, and fatigue.  Skin:   Patient denies skin rash/ lesion and itching.  Eyes:   Patient denies blurred vision and double vision.  Ears/ Nose/ Throat:   Patient denies sore throat and sinus problems.  Hematologic/Lymphatic:   Patient denies swollen glands and easy bruising.  Cardiovascular:   Patient denies leg swelling and chest pains.  Respiratory:   Patient denies cough and shortness of breath.  Endocrine:   Patient denies excessive thirst.  Musculoskeletal:   Patient denies back pain and joint pain.  Neurological:   Patient denies headaches and dizziness.  Psychologic:   Patient denies depression and anxiety.   VITAL SIGNS:      07/02/2024 11:12 AM  Weight 175 lb / 79.38 kg  Height  65 in / 165.1 cm  BMI 29.1 kg/m   MULTI-SYSTEM PHYSICAL EXAMINATION:    Constitutional: Well-nourished. No physical deformities. Normally developed. Good grooming.  Respiratory: Normal breath sounds. No labored breathing, no use of accessory muscles.   Cardiovascular: Regular rate and rhythm. No murmur, no gallop.      Complexity of Data:  Records Review:   Previous Patient Records  Urine Test Review:   Urinalysis, 24 Hour Urine  X-Ray Review: C.T. Stone Protocol: Reviewed Films. Discussed With Patient.     PROCEDURES:         C.T. Urogram - [id number]  There is moderate right hydro with a 5mm RLP stone and an 8x31mm RUVJ stone that is obstructing. There are no left renal stones or obstruction.       Patient confirmed No Neulasta OnPro Device.         Visit Complexity - G2211 Chronic management         Urinalysis w/Scope Dipstick Dipstick Cont'd Micro  Color: Yellow Bilirubin: Neg mg/dL WBC/hpf: NS (Not Seen)  Appearance: Clear Ketones: Neg mg/dL RBC/hpf: 0 - 2/hpf  Specific Gravity: 1.020 Blood: Trace ery/uL Bacteria: NS (Not Seen)  pH: <=5.0 Protein: Neg mg/dL Cystals:  NS (Not Seen)  Glucose: Neg mg/dL Urobilinogen: 0.2 mg/dL Casts: NS (Not Seen)    Nitrites: Neg Trichomonas: Not Present    Leukocyte Esterase: Neg leu/uL Mucous: Not Present      Epithelial Cells: 0 - 5/hpf      Yeast: NS (Not Seen)      Sperm: Not Present    ASSESSMENT:      ICD-10 Details  1 GU:   Renal calculus - N20.0 Chronic, Stable - She has a 5mm RLP stone with an 8x73mm RUVJ stone with obstruction.   2   Ureteral calculus - N20.1 Acute, Threat to Bodily Function - I discussed options including MET, URS and ESWL and will try to get her scheduled for URS tomorrow to get the distal and renal stones.   I have reviewed the risks of ureteroscopy including bleeding, infection, ureteral injury, need for a stent or secondary procedures, thrombotic events and anesthetic complications.    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 recommended by PCP.            Orders Labs Uric Acid  X-Rays: C.T. Stone Protocol Without I.V. Contrast  X-Ray Notes:   History:   Hematuria: Yes / No   Patient to see MD after exam: Yes/ No   Previous exam:   When:   Where:   Diabetic: Yes / No   BUN/ Creatinine:   Date of last BUN Creatinine:   Weight in pounds:   Allergy- IV Contrast: Yes/ No  Prior Authorization #BETHA [facility] shara # [id number] valid 07/02/24 thru 08/31/24           Schedule Return Visit/Planned Activity: ASAP - Schedule Surgery  Procedure: Approximately 1 Day - Cysto Uretero Lithotripsy - 47646          Document Letter(s):  Created for Patient: Clinical Summary   Created for Chart: Work Excuse         Notes:   CC: Dr. Richard Tisovec.         Next Appointment:      Next Appointment: 07/09/2024 12:45 PM    Appointment Type: Renal Ultrasound    Location: [facility] [phone]    Provider: Radiology Rm1 Radiology Rm 1    Reason for Visit: 4 mo KUB, Renal ultra s, wiht ov / Teresa Lopez

## 2024-07-02 NOTE — Progress Notes (Addendum)
 COVID Vaccine Completed:  Date of COVID positive in last 90 days:  No  PCP - Charlie Reas, MD Cardiologist - N/A  Chest x-ray - N/A EKG - 08-24-23 Epic Stress Test - N/A ECHO - N/A Cardiac Cath - N/A Pacemaker/ICD device last checked:N/A Spinal Cord Stimulator:N/A  Bowel Prep - N/A  Sleep Study - N/A CPAP -   Fasting Blood Sugar - N/A Checks Blood Sugar _____ times a day  Last dose of GLP1 agonist-  N/A GLP1 instructions:  Do not take after     Last dose of SGLT-2 inhibitors-  N/A SGLT-2 instructions:  Do not take after    Blood Thinner Instructions:  Last dose:   Time: Aspirin Instructions: ASA 81 mg x2 at night.  Patient states that she is to stay on per surgeon. Last Dose:  Activity level:  Can go up a flight of stairs and perform activities of daily living without stopping and without symptoms of chest pain or shortness of breath.  Anesthesia review:   N/A  Patient denies shortness of breath, fever, cough and chest pain at PAT appointment (completed over the phone)  Patient verbalized understanding of instructions that were given to them at the PAT appointment. Patient was also instructed that they will need to review over the PAT instructions again at home before surgery.

## 2024-07-03 ENCOUNTER — Encounter (HOSPITAL_COMMUNITY)
Admission: RE | Disposition: A | Discharge status: Home / Self Care | Payer: Self-pay | Source: Home / Self Care | Attending: Urology

## 2024-07-03 ENCOUNTER — Other Ambulatory Visit: Payer: Self-pay

## 2024-07-03 ENCOUNTER — Ambulatory Visit (HOSPITAL_COMMUNITY): Admitting: Anesthesiology

## 2024-07-03 ENCOUNTER — Ambulatory Visit (HOSPITAL_COMMUNITY)

## 2024-07-03 ENCOUNTER — Ambulatory Visit (HOSPITAL_COMMUNITY)
Admission: RE | Admit: 2024-07-03 | Discharge: 2024-07-03 | Disposition: A | Discharge status: Home / Self Care | Attending: Urology | Admitting: Urology

## 2024-07-03 ENCOUNTER — Encounter (HOSPITAL_COMMUNITY): Payer: Self-pay | Admitting: Urology

## 2024-07-03 DIAGNOSIS — Z87891 Personal history of nicotine dependence: Secondary | ICD-10-CM | POA: Diagnosis not present

## 2024-07-03 DIAGNOSIS — F32A Depression, unspecified: Secondary | ICD-10-CM

## 2024-07-03 DIAGNOSIS — Z87442 Personal history of urinary calculi: Secondary | ICD-10-CM | POA: Diagnosis not present

## 2024-07-03 DIAGNOSIS — Z86718 Personal history of other venous thrombosis and embolism: Secondary | ICD-10-CM | POA: Diagnosis not present

## 2024-07-03 DIAGNOSIS — N210 Calculus in bladder: Secondary | ICD-10-CM | POA: Diagnosis present

## 2024-07-03 DIAGNOSIS — N202 Calculus of kidney with calculus of ureter: Secondary | ICD-10-CM | POA: Diagnosis not present

## 2024-07-03 DIAGNOSIS — K219 Gastro-esophageal reflux disease without esophagitis: Secondary | ICD-10-CM | POA: Diagnosis not present

## 2024-07-03 DIAGNOSIS — I10 Essential (primary) hypertension: Secondary | ICD-10-CM | POA: Insufficient documentation

## 2024-07-03 DIAGNOSIS — N132 Hydronephrosis with renal and ureteral calculous obstruction: Secondary | ICD-10-CM | POA: Diagnosis not present

## 2024-07-03 DIAGNOSIS — N201 Calculus of ureter: Secondary | ICD-10-CM | POA: Diagnosis present

## 2024-07-03 HISTORY — PX: CYSTOSCOPY/URETEROSCOPY/HOLMIUM LASER/STENT PLACEMENT: SHX6546

## 2024-07-03 LAB — BASIC METABOLIC PANEL WITH GFR
Anion gap: 9 (ref 5–15)
BUN: 34 mg/dL — ABNORMAL HIGH (ref 8–23)
CO2: 20 mmol/L — ABNORMAL LOW (ref 22–32)
Calcium: 9.3 mg/dL (ref 8.9–10.3)
Chloride: 105 mmol/L (ref 98–111)
Creatinine, Ser: 2.22 mg/dL — ABNORMAL HIGH (ref 0.44–1.00)
GFR, Estimated: 24 mL/min — ABNORMAL LOW (ref >60)
Glucose, Bld: 113 mg/dL — ABNORMAL HIGH (ref 70–99)
Potassium: 5.2 mmol/L — ABNORMAL HIGH (ref 3.5–5.1)
Sodium: 134 mmol/L — ABNORMAL LOW (ref 135–145)

## 2024-07-03 LAB — CBC WITH DIFFERENTIAL/PLATELET
Abs Immature Granulocytes: 0.04 K/uL (ref 0.00–0.07)
Basophils Absolute: 0 K/uL (ref 0.0–0.1)
Basophils Relative: 1 %
Eosinophils Absolute: 0.3 K/uL (ref 0.0–0.5)
Eosinophils Relative: 4 %
HCT: 34.1 % — ABNORMAL LOW (ref 36.0–46.0)
Hemoglobin: 10.8 g/dL — ABNORMAL LOW (ref 12.0–15.0)
Immature Granulocytes: 1 %
Lymphocytes Relative: 20 %
Lymphs Abs: 1.5 K/uL (ref 0.7–4.0)
MCH: 31.3 pg (ref 26.0–34.0)
MCHC: 31.7 g/dL (ref 30.0–36.0)
MCV: 98.8 fL (ref 80.0–100.0)
Monocytes Absolute: 0.8 K/uL (ref 0.1–1.0)
Monocytes Relative: 12 %
Neutro Abs: 4.6 K/uL (ref 1.7–7.7)
Neutrophils Relative %: 62 %
Platelets: 185 K/uL (ref 150–400)
RBC: 3.45 MIL/uL — ABNORMAL LOW (ref 3.87–5.11)
RDW: 13.7 % (ref 11.5–15.5)
WBC: 7.2 K/uL (ref 4.0–10.5)
nRBC: 0 % (ref 0.0–0.2)

## 2024-07-03 SURGERY — CYSTOSCOPY/URETEROSCOPY/HOLMIUM LASER/STENT PLACEMENT
Anesthesia: General | Site: Ureter | Laterality: Right

## 2024-07-03 MED ORDER — FENTANYL CITRATE (PF) 100 MCG/2ML IJ SOLN
INTRAMUSCULAR | Status: AC
Start: 2024-07-03 — End: 2024-07-03
  Filled 2024-07-03: qty 2

## 2024-07-03 MED ORDER — ACETAMINOPHEN 325 MG PO TABS: 650.0000 mg | ORAL_TABLET | ORAL | Status: DC | PRN

## 2024-07-03 MED ORDER — MIDAZOLAM HCL 5 MG/5ML IJ SOLN
INTRAMUSCULAR | Status: DC | PRN
  Administered 2024-07-03: 2 mg via INTRAVENOUS

## 2024-07-03 MED ORDER — MORPHINE SULFATE (PF) 2 MG/ML IV SOLN: 2.0000 mg | INTRAVENOUS | Status: DC | PRN

## 2024-07-03 MED ORDER — SODIUM CHLORIDE 0.9 % IR SOLN
Status: DC | PRN
Start: 2024-07-03 — End: 2024-07-03
  Administered 2024-07-03: 3000 mL

## 2024-07-03 MED ORDER — ACETAMINOPHEN 650 MG RE SUPP: 650.0000 mg | RECTAL | Status: DC | PRN

## 2024-07-03 MED ORDER — PROPOFOL 10 MG/ML IV BOLUS
INTRAVENOUS | Status: AC
  Filled 2024-07-03: qty 20

## 2024-07-03 MED ORDER — ORAL CARE MOUTH RINSE: 15.0000 mL | Freq: Once | OROMUCOSAL | Status: AC

## 2024-07-03 MED ORDER — MIDAZOLAM HCL 2 MG/2ML IJ SOLN
INTRAMUSCULAR | Status: AC
  Filled 2024-07-03: qty 2

## 2024-07-03 MED ORDER — SODIUM CHLORIDE 0.9 % IV SOLN: 250.0000 mL | INTRAVENOUS | Status: DC | PRN

## 2024-07-03 MED ORDER — OXYCODONE HCL 5 MG PO TABS
5.0000 mg | ORAL_TABLET | ORAL | Status: DC | PRN
  Administered 2024-07-03: 10 mg via ORAL

## 2024-07-03 MED ORDER — OXYCODONE HCL 5 MG/5ML PO SOLN: 5.0000 mg | Freq: Once | ORAL | Status: DC | PRN

## 2024-07-03 MED ORDER — SODIUM CHLORIDE 0.9 % IV SOLN: 12.5000 mg | INTRAVENOUS | Status: DC | PRN

## 2024-07-03 MED ORDER — 0.9 % SODIUM CHLORIDE (POUR BTL) OPTIME
TOPICAL | Status: DC | PRN
  Administered 2024-07-03: 1000 mL

## 2024-07-03 MED ORDER — CIPROFLOXACIN IN D5W 400 MG/200ML IV SOLN
400.0000 mg | INTRAVENOUS | Status: AC
  Administered 2024-07-03: 400 mg via INTRAVENOUS
  Filled 2024-07-03: qty 200

## 2024-07-03 MED ORDER — OXYCODONE HCL 5 MG PO TABS: 5.0000 mg | ORAL_TABLET | Freq: Once | ORAL | Status: DC | PRN

## 2024-07-03 MED ORDER — LACTATED RINGERS IV SOLN
INTRAVENOUS | Status: DC
Start: 2024-07-03 — End: 2024-07-03

## 2024-07-03 MED ORDER — AMISULPRIDE (ANTIEMETIC) 5 MG/2ML IV SOLN: 10.0000 mg | Freq: Once | INTRAVENOUS | Status: DC | PRN

## 2024-07-03 MED ORDER — LIDOCAINE HCL (PF) 2 % IJ SOLN
INTRAMUSCULAR | Status: AC
  Filled 2024-07-03: qty 5

## 2024-07-03 MED ORDER — FENTANYL CITRATE (PF) 100 MCG/2ML IJ SOLN
INTRAMUSCULAR | Status: DC | PRN
  Administered 2024-07-03: 100 ug via INTRAVENOUS

## 2024-07-03 MED ORDER — SODIUM CHLORIDE 0.9% FLUSH
3.0000 mL | INTRAVENOUS | Status: DC | PRN
Start: 2024-07-03 — End: 2024-07-03

## 2024-07-03 MED ORDER — OXYCODONE HCL 5 MG PO TABS
ORAL_TABLET | ORAL | Status: AC
  Filled 2024-07-03: qty 1

## 2024-07-03 MED ORDER — SODIUM CHLORIDE 0.9% FLUSH: 3.0000 mL | Freq: Two times a day (BID) | INTRAVENOUS | Status: DC

## 2024-07-03 MED ORDER — HYDROCODONE-ACETAMINOPHEN 5-325 MG PO TABS: 1.0000 | ORAL_TABLET | Freq: Four times a day (QID) | ORAL | 0 refills | Status: AC | PRN

## 2024-07-03 MED ORDER — DEXAMETHASONE SODIUM PHOSPHATE 10 MG/ML IJ SOLN
INTRAMUSCULAR | Status: AC
  Filled 2024-07-03: qty 1

## 2024-07-03 MED ORDER — EPHEDRINE SULFATE-NACL 50-0.9 MG/10ML-% IV SOSY
PREFILLED_SYRINGE | INTRAVENOUS | Status: DC | PRN
  Administered 2024-07-03: 15 mg via INTRAVENOUS
  Administered 2024-07-03: 10 mg via INTRAVENOUS

## 2024-07-03 MED ORDER — ONDANSETRON HCL 4 MG/2ML IJ SOLN
INTRAMUSCULAR | Status: AC
  Filled 2024-07-03: qty 2

## 2024-07-03 MED ORDER — HYDROMORPHONE HCL 1 MG/ML IJ SOLN: 0.2500 mg | INTRAMUSCULAR | Status: DC | PRN

## 2024-07-03 MED ORDER — LIDOCAINE HCL (PF) 2 % IJ SOLN
INTRAMUSCULAR | Status: DC | PRN
Start: 2024-07-03 — End: 2024-07-03
  Administered 2024-07-03: 100 mg via INTRADERMAL

## 2024-07-03 MED ORDER — DEXAMETHASONE SODIUM PHOSPHATE 10 MG/ML IJ SOLN
INTRAMUSCULAR | Status: DC | PRN
  Administered 2024-07-03: 10 mg via INTRAVENOUS

## 2024-07-03 MED ORDER — PROPOFOL 10 MG/ML IV BOLUS
INTRAVENOUS | Status: DC | PRN
  Administered 2024-07-03: 120 mg via INTRAVENOUS

## 2024-07-03 MED ORDER — CHLORHEXIDINE GLUCONATE 0.12 % MT SOLN
15.0000 mL | Freq: Once | OROMUCOSAL | Status: AC
  Administered 2024-07-03: 15 mL via OROMUCOSAL

## 2024-07-03 MED ORDER — ONDANSETRON HCL 4 MG/2ML IJ SOLN
INTRAMUSCULAR | Status: DC | PRN
  Administered 2024-07-03: 4 mg via INTRAVENOUS

## 2024-07-03 SURGICAL SUPPLY — 19 items
BAG URO CATCHER STRL LF (MISCELLANEOUS) ×1 IMPLANT
BASKET STONE NCOMPASS (UROLOGICAL SUPPLIES) IMPLANT
CATH URETERAL DUAL LUMEN 10F (MISCELLANEOUS) IMPLANT
CATH URETL OPEN 5X70 (CATHETERS) IMPLANT
CLOTH BEACON ORANGE TIMEOUT ST (SAFETY) ×1 IMPLANT
EXTRACTOR STONE NITINOL NGAGE (UROLOGICAL SUPPLIES) IMPLANT
GLOVE SURG SS PI 8.0 STRL IVOR (GLOVE) ×1 IMPLANT
GOWN STRL SURGICAL XL XLNG (GOWN DISPOSABLE) ×1 IMPLANT
GUIDEWIRE STR DUAL SENSOR (WIRE) ×1 IMPLANT
KIT TURNOVER KIT A (KITS) ×1 IMPLANT
LASER FIB FLEXIVA PULSE ID 550 (Laser) IMPLANT
LASER FIB FLEXIVA PULSE ID 910 (Laser) IMPLANT
MANIFOLD NEPTUNE II (INSTRUMENTS) ×1 IMPLANT
PACK CYSTO (CUSTOM PROCEDURE TRAY) ×1 IMPLANT
SHEATH NAVIGATOR HD 11/13X36 (SHEATH) IMPLANT
STENT URET 6FRX24 CONTOUR (STENTS) IMPLANT
TRACTIP FLEXIVA PULS ID 200XHI (Laser) IMPLANT
TUBING CONNECTING 10 (TUBING) ×1 IMPLANT
TUBING UROLOGY SET (TUBING) ×1 IMPLANT

## 2024-07-03 NOTE — Discharge Instructions (Addendum)
 You may remove the stent on Friday morning by pulling the string that is tucked vaginally.   If you don't feel you can do that, we can remove it at your f/u on 7/21.   Bring your stone fragments to the office for analysis.

## 2024-07-03 NOTE — Op Note (Signed)
 Procedure: 1.  Cystoscopy with right ureteroscopy with laser fragmentation and extraction of a right distal ureteral stone. 2.  Right ureteroscopy with laser fragmentation and extraction of a right lower pole renal stone with insertion of right double-J stent. 3.  Application of fluoroscopy.  Preop diagnosis: 5 x 8 mm right distal ureteral stone  with obstruction and 7 mm right lower pole stone.  Postop diagnosis: Same.  Surgeon: Dr. Norleen Seltzer.  Anesthesia: General.  Specimen: Stone fragments.  Drains: 6 French by 24 cm right double-J stent with tether.  EBL: None.  Complications: None.  Indications: The patient is a 68 year old female who presented the office yesterday with right back and low were abdominal pain and was found on CT to have an 8 x 5 mm right distal ureteral stone with moderate hydro ureteral nephrosis and a 7 mm right lower pole stone.  She is elected ureteroscopy for management.  Procedure: She was taken the operating room where she was given an antibiotic.  A general anesthetic was induced.  She was placed in lithotomy position and fitted with PAS hose. The genitalia were prepped with Betadine solution she was draped in usual sterile fashion.  Cystoscopy was performed with the 21 [place] scope and 30 degree lens.  Examination revealed a normal urethra.  The bladder wall is smooth and pale without tumors or inflammation.  There were a few small yellowish stone granules at the base of the bladder.  The ureteral orifices were unremarkable.  The right ureteral orifice was cannulated with 5 [place] open-ended catheter to allow placement of a sensor wire to the kidney under fluoroscopic guidance.  The cystoscope was removed and a dual-lumen semirigid ureteroscope was then advanced per urethra alongside the wire without difficulty into the distal ureter where the stone was visualized.  A 242 m holmium laser fiber was then passed and the stone was broken into manageable  fragments which were then removed to the bladder using an engage basket.  Once the distal stone had been managed a 11 x 13 French 36 cm digital access sheath was passed over the working wire into the proximal ureter and inner core and wire removed.  Eventually a second wire was placed to act as a safety wire due to some tortuosity of proximal ureter.  The stone was visualized in the lower pole of the kidney and was grasped with an engage basket and moved into the proximal ureter because of significant renal inspiratory movement.  Once in a good position, the stone was then fragmented into manageable fragments which were then removed with the engage basket.  Once all significant fragments have been removed, final inspection revealed no residual renal stones or ureteral injury.  Ureteroscope was backed out no additional ureteral abnormalities were noted although there was one stone fragment that flushed out upon removal of the scope and sheath.  I then passed the semirigid scope up the ureter 1 last time to the UPJ to ensure that no significant fragments remaining and none were noted.  A 6 French by 24 cm contour double-J stent was then advanced kidney under fluoroscopic guidance.  The wire was removed, a good coil in the kidney and a good coil in the bladder.  The stent string was left exiting urethra.  The cystoscope was then reinserted and the bladder was evacuated free of stone fragments and the stent position was confirmed with cystoscopy and fluoroscopy.  The bladder was then drained and the cystoscope was removed.  The stent  string was tied close to the meatus and trimmed to an appropriate length before being tucked vaginally and a tampon string fashion.  She was taken down from lithotomy position, her anesthetic was reversed and she was moved recovery in stable fashion.  There were no complications.

## 2024-07-03 NOTE — Anesthesia Procedure Notes (Signed)
 Procedure Name: LMA Insertion Date/Time: 07/03/2024 8:58 AM  Performed by: Carleton Garnette SAUNDERS, CRNAPre-anesthesia Checklist: Patient identified, Emergency Drugs available, Suction available, Patient being monitored and Timeout performed Patient Re-evaluated:Patient Re-evaluated prior to induction Oxygen Delivery Method: Circle system utilized Preoxygenation: Pre-oxygenation with 100% oxygen Induction Type: IV induction LMA: LMA inserted LMA Size: 4.0 Tube type: Oral Number of attempts: 1 Placement Confirmation: positive ETCO2 and breath sounds checked- equal and bilateral Tube secured with: Tape Dental Injury: Teeth and Oropharynx as per pre-operative assessment

## 2024-07-03 NOTE — Anesthesia Postprocedure Evaluation (Signed)
 Anesthesia Post Note  Patient: Teresa Lopez  Procedure(s) Performed: CYSTOSCOPY/URETEROSCOPY/HOLMIUM LASER/STENT PLACEMENT (Right: Ureter)     Patient location during evaluation: PACU Anesthesia Type: General Level of consciousness: awake and alert Pain management: pain level controlled Vital Signs Assessment: post-procedure vital signs reviewed and stable Respiratory status: spontaneous breathing, nonlabored ventilation and respiratory function stable Cardiovascular status: blood pressure returned to baseline and stable Postop Assessment: no apparent nausea or vomiting Anesthetic complications: no   No notable events documented.  Last Vitals:  Vitals:   07/03/24 1045 07/03/24 1100  BP: (!) 158/104 (!) 160/100  Pulse: (!) 58 62  Resp: 10 12  Temp:  36.6 C  SpO2: 96% 96%    Last Pain:  Vitals:   07/03/24 1045  TempSrc:   PainSc: 0-No pain                 Butler Levander Pinal

## 2024-07-03 NOTE — Anesthesia Preprocedure Evaluation (Signed)
 Anesthesia Evaluation  Patient identified by MRN, date of birth, ID band Patient awake    Reviewed: Allergy & Precautions, NPO status , Patient's Chart, lab work & pertinent test results  Airway Mallampati: II  TM Distance: >3 FB Neck ROM: Full    Dental no notable dental hx. (+) Teeth Intact, Dental Advisory Given   Pulmonary former smoker   Pulmonary exam normal breath sounds clear to auscultation       Cardiovascular hypertension, Pt. on medications + DVT (hx)  Normal cardiovascular exam Rhythm:Regular Rate:Normal     Neuro/Psych    Depression    Aneurysm clipping 2018 TIA   GI/Hepatic ,GERD  ,,  Endo/Other    Renal/GU Renal diseaseLab Results      Component                Value               Date                            K                        4.1                 09/05/2023                CREATININE               0.75                09/05/2023          Nephrolithiasis- [place]     Musculoskeletal  (+) Arthritis , Osteoarthritis,    Abdominal   Peds  Hematology Lab Results      Component                Value               Date                      WBC                      6.0                 09/05/2023                HGB                      11.9 (L)            09/05/2023                HCT                      36.8                09/05/2023                MCV                      99.7                09/05/2023                PLT  173                 09/05/2023              Anesthesia Other Findings All: sulfa, pcn, codeine  Reproductive/Obstetrics                              Anesthesia Physical Anesthesia Plan  ASA: 3  Anesthesia Plan: General   Post-op Pain Management:    Induction: Intravenous  PONV Risk Score and Plan: 3 and Treatment may vary due to age or medical condition, Midazolam , Ondansetron  and Dexamethasone   Airway Management  Planned: LMA  Additional Equipment: None  Intra-op Plan:   Post-operative Plan: Extubation in OR  Informed Consent: I have reviewed the patients History and Physical, chart, labs and discussed the procedure including the risks, benefits and alternatives for the proposed anesthesia with the patient or authorized representative who has indicated his/her understanding and acceptance.     Dental advisory given  Plan Discussed with: CRNA and Anesthesiologist  Anesthesia Plan Comments:          Anesthesia Quick Evaluation

## 2024-07-03 NOTE — Transfer of Care (Signed)
 Immediate Anesthesia Transfer of Care Note  Patient: Teresa Lopez  Procedure(s) Performed: CYSTOSCOPY/URETEROSCOPY/HOLMIUM LASER/STENT PLACEMENT (Right: Ureter)  Patient Location: PACU  Anesthesia Type:General  Level of Consciousness: sedated  Airway & Oxygen Therapy: Patient Spontanous Breathing and Patient connected to face mask oxygen  Post-op Assessment: Report given to RN and Post -op Vital signs reviewed and stable  Post vital signs: Reviewed and stable  Last Vitals:  Vitals Value Taken Time  BP    Temp    Pulse 58 07/03/24 09:59  Resp    SpO2 100 % 07/03/24 09:59  Vitals shown include unfiled device data.  Last Pain:  Vitals:   07/03/24 0711  TempSrc: Oral         Complications: No notable events documented.

## 2024-07-03 NOTE — Interval H&P Note (Signed)
 History and Physical Interval Note:  He hasn't passed the stone.   07/03/2024 7:26 AM  Teresa Lopez  has presented today for surgery, with the diagnosis of RIGHT URETERAL STONE AND BLADDER STONE.  The various methods of treatment have been discussed with the patient and family. After consideration of risks, benefits and other options for treatment, the patient has consented to  Procedure(s): CYSTOSCOPY/URETEROSCOPY/HOLMIUM LASER/STENT PLACEMENT (Right) as a surgical intervention.  The patient's history has been reviewed, patient examined, no change in status, stable for surgery.  I have reviewed the patient's chart and labs.  Questions were answered to the patient's satisfaction.     Joseth Weigel

## 2024-07-03 NOTE — Progress Notes (Signed)
 Dr. Cleotilde notified patient had elevated blood pressure.

## 2024-07-04 ENCOUNTER — Encounter (HOSPITAL_COMMUNITY): Payer: Self-pay | Admitting: Urology

## 2024-07-09 DIAGNOSIS — N132 Hydronephrosis with renal and ureteral calculous obstruction: Secondary | ICD-10-CM | POA: Diagnosis not present

## 2024-07-09 DIAGNOSIS — N202 Calculus of kidney with calculus of ureter: Secondary | ICD-10-CM | POA: Diagnosis not present

## 2024-07-10 ENCOUNTER — Other Ambulatory Visit: Payer: Self-pay | Admitting: Internal Medicine

## 2024-07-10 DIAGNOSIS — Z1231 Encounter for screening mammogram for malignant neoplasm of breast: Secondary | ICD-10-CM

## 2024-07-17 ENCOUNTER — Ambulatory Visit
Admission: RE | Admit: 2024-07-17 | Discharge: 2024-07-17 | Disposition: A | Discharge status: Home / Self Care | Source: Ambulatory Visit | Attending: Internal Medicine | Admitting: Internal Medicine

## 2024-07-17 DIAGNOSIS — Z1231 Encounter for screening mammogram for malignant neoplasm of breast: Secondary | ICD-10-CM

## 2024-08-14 DIAGNOSIS — L821 Other seborrheic keratosis: Secondary | ICD-10-CM | POA: Diagnosis not present

## 2024-08-14 DIAGNOSIS — L578 Other skin changes due to chronic exposure to nonionizing radiation: Secondary | ICD-10-CM | POA: Diagnosis not present

## 2024-08-21 DIAGNOSIS — N200 Calculus of kidney: Secondary | ICD-10-CM | POA: Diagnosis not present

## 2024-08-21 DIAGNOSIS — N132 Hydronephrosis with renal and ureteral calculous obstruction: Secondary | ICD-10-CM | POA: Diagnosis not present

## 2024-09-12 DIAGNOSIS — H04123 Dry eye syndrome of bilateral lacrimal glands: Secondary | ICD-10-CM | POA: Diagnosis not present

## 2024-09-12 DIAGNOSIS — H353132 Nonexudative age-related macular degeneration, bilateral, intermediate dry stage: Secondary | ICD-10-CM | POA: Diagnosis not present

## 2024-09-12 DIAGNOSIS — H16223 Keratoconjunctivitis sicca, not specified as Sjogren's, bilateral: Secondary | ICD-10-CM | POA: Diagnosis not present

## 2024-09-12 DIAGNOSIS — H524 Presbyopia: Secondary | ICD-10-CM | POA: Diagnosis not present

## 2024-09-12 DIAGNOSIS — H35033 Hypertensive retinopathy, bilateral: Secondary | ICD-10-CM | POA: Diagnosis not present

## 2024-09-17 DIAGNOSIS — R0781 Pleurodynia: Secondary | ICD-10-CM | POA: Diagnosis not present

## 2024-09-17 DIAGNOSIS — W1809XA Striking against other object with subsequent fall, initial encounter: Secondary | ICD-10-CM | POA: Diagnosis not present

## 2024-09-17 DIAGNOSIS — I10 Essential (primary) hypertension: Secondary | ICD-10-CM | POA: Diagnosis not present
